# Patient Record
Sex: Male | Born: 1935 | Race: White | Hispanic: No | Marital: Married | Smoking: Former smoker
Health system: Southern US, Community
[De-identification: ages and names within clinical notes are randomized; demographics above are authoritative.]

## PROBLEM LIST (undated history)

## (undated) DIAGNOSIS — D333 Benign neoplasm of cranial nerves: Secondary | ICD-10-CM

## (undated) DIAGNOSIS — R609 Edema, unspecified: Secondary | ICD-10-CM

## (undated) DIAGNOSIS — M4800 Spinal stenosis, site unspecified: Secondary | ICD-10-CM

## (undated) DIAGNOSIS — F32A Depression, unspecified: Secondary | ICD-10-CM

## (undated) DIAGNOSIS — G500 Trigeminal neuralgia: Secondary | ICD-10-CM

## (undated) DIAGNOSIS — K2270 Barrett's esophagus without dysplasia: Secondary | ICD-10-CM

## (undated) DIAGNOSIS — F329 Major depressive disorder, single episode, unspecified: Secondary | ICD-10-CM

## (undated) DIAGNOSIS — G629 Polyneuropathy, unspecified: Secondary | ICD-10-CM

## (undated) DIAGNOSIS — K219 Gastro-esophageal reflux disease without esophagitis: Secondary | ICD-10-CM

## (undated) DIAGNOSIS — E119 Type 2 diabetes mellitus without complications: Secondary | ICD-10-CM

## (undated) DIAGNOSIS — N159 Renal tubulo-interstitial disease, unspecified: Secondary | ICD-10-CM

## (undated) HISTORY — PX: HERNIA REPAIR: SHX51

## (undated) HISTORY — PX: BRAIN SURGERY: SHX531

## (undated) HISTORY — PX: APPENDECTOMY: SHX54

---

## 2017-05-02 ENCOUNTER — Emergency Department (HOSPITAL_BASED_OUTPATIENT_CLINIC_OR_DEPARTMENT_OTHER): Payer: Medicare Other

## 2017-05-02 ENCOUNTER — Encounter (HOSPITAL_BASED_OUTPATIENT_CLINIC_OR_DEPARTMENT_OTHER): Payer: Self-pay

## 2017-05-02 ENCOUNTER — Other Ambulatory Visit: Payer: Self-pay

## 2017-05-02 ENCOUNTER — Observation Stay (HOSPITAL_BASED_OUTPATIENT_CLINIC_OR_DEPARTMENT_OTHER)
Admission: EM | Admit: 2017-05-02 | Discharge: 2017-05-03 | Disposition: A | Discharge status: Home / Self Care | Payer: Medicare Other | Attending: Internal Medicine | Admitting: Internal Medicine

## 2017-05-02 DIAGNOSIS — K219 Gastro-esophageal reflux disease without esophagitis: Secondary | ICD-10-CM | POA: Diagnosis present

## 2017-05-02 DIAGNOSIS — G500 Trigeminal neuralgia: Secondary | ICD-10-CM | POA: Diagnosis present

## 2017-05-02 DIAGNOSIS — Z886 Allergy status to analgesic agent status: Secondary | ICD-10-CM | POA: Insufficient documentation

## 2017-05-02 DIAGNOSIS — E1140 Type 2 diabetes mellitus with diabetic neuropathy, unspecified: Secondary | ICD-10-CM | POA: Insufficient documentation

## 2017-05-02 DIAGNOSIS — Z79899 Other long term (current) drug therapy: Secondary | ICD-10-CM | POA: Diagnosis not present

## 2017-05-02 DIAGNOSIS — W19XXXA Unspecified fall, initial encounter: Secondary | ICD-10-CM | POA: Diagnosis present

## 2017-05-02 DIAGNOSIS — R001 Bradycardia, unspecified: Secondary | ICD-10-CM | POA: Diagnosis not present

## 2017-05-02 DIAGNOSIS — Z885 Allergy status to narcotic agent status: Secondary | ICD-10-CM | POA: Insufficient documentation

## 2017-05-02 DIAGNOSIS — Z87891 Personal history of nicotine dependence: Secondary | ICD-10-CM | POA: Diagnosis not present

## 2017-05-02 DIAGNOSIS — S0990XA Unspecified injury of head, initial encounter: Secondary | ICD-10-CM | POA: Diagnosis not present

## 2017-05-02 DIAGNOSIS — E119 Type 2 diabetes mellitus without complications: Secondary | ICD-10-CM | POA: Diagnosis present

## 2017-05-02 DIAGNOSIS — S2232XA Fracture of one rib, left side, initial encounter for closed fracture: Secondary | ICD-10-CM | POA: Diagnosis present

## 2017-05-02 DIAGNOSIS — L8990 Pressure ulcer of unspecified site, unspecified stage: Secondary | ICD-10-CM

## 2017-05-02 DIAGNOSIS — S2242XA Multiple fractures of ribs, left side, initial encounter for closed fracture: Principal | ICD-10-CM | POA: Insufficient documentation

## 2017-05-02 DIAGNOSIS — Z7984 Long term (current) use of oral hypoglycemic drugs: Secondary | ICD-10-CM | POA: Insufficient documentation

## 2017-05-02 DIAGNOSIS — N400 Enlarged prostate without lower urinary tract symptoms: Secondary | ICD-10-CM | POA: Diagnosis present

## 2017-05-02 DIAGNOSIS — W010XXA Fall on same level from slipping, tripping and stumbling without subsequent striking against object, initial encounter: Secondary | ICD-10-CM | POA: Insufficient documentation

## 2017-05-02 DIAGNOSIS — S2249XA Multiple fractures of ribs, unspecified side, initial encounter for closed fracture: Secondary | ICD-10-CM

## 2017-05-02 DIAGNOSIS — H9190 Unspecified hearing loss, unspecified ear: Secondary | ICD-10-CM | POA: Diagnosis not present

## 2017-05-02 HISTORY — DX: Polyneuropathy, unspecified: G62.9

## 2017-05-02 HISTORY — DX: Gastro-esophageal reflux disease without esophagitis: K21.9

## 2017-05-02 HISTORY — DX: Benign neoplasm of cranial nerves: D33.3

## 2017-05-02 HISTORY — DX: Edema, unspecified: R60.9

## 2017-05-02 HISTORY — DX: Barrett's esophagus without dysplasia: K22.70

## 2017-05-02 HISTORY — DX: Spinal stenosis, site unspecified: M48.00

## 2017-05-02 HISTORY — DX: Renal tubulo-interstitial disease, unspecified: N15.9

## 2017-05-02 HISTORY — DX: Major depressive disorder, single episode, unspecified: F32.9

## 2017-05-02 HISTORY — DX: Depression, unspecified: F32.A

## 2017-05-02 HISTORY — DX: Trigeminal neuralgia: G50.0

## 2017-05-02 HISTORY — DX: Type 2 diabetes mellitus without complications: E11.9

## 2017-05-02 LAB — CBC WITH DIFFERENTIAL/PLATELET
BASOS PCT: 0 %
Basophils Absolute: 0 10*3/uL (ref 0.0–0.1)
EOS ABS: 0 10*3/uL (ref 0.0–0.7)
EOS PCT: 0 %
HEMATOCRIT: 36.4 % — AB (ref 39.0–52.0)
HEMOGLOBIN: 12.5 g/dL — AB (ref 13.0–17.0)
Lymphocytes Relative: 16 %
Lymphs Abs: 1.7 10*3/uL (ref 0.7–4.0)
MCH: 37.7 pg — AB (ref 26.0–34.0)
MCHC: 34.3 g/dL (ref 30.0–36.0)
MCV: 109.6 fL — AB (ref 78.0–100.0)
MONOS PCT: 7 %
Monocytes Absolute: 0.7 10*3/uL (ref 0.1–1.0)
NEUTROS ABS: 8 10*3/uL — AB (ref 1.7–7.7)
Neutrophils Relative %: 77 %
Platelets: 198 10*3/uL (ref 150–400)
RBC: 3.32 MIL/uL — ABNORMAL LOW (ref 4.22–5.81)
RDW: 13.7 % (ref 11.5–15.5)
WBC: 10.4 10*3/uL (ref 4.0–10.5)

## 2017-05-02 LAB — BASIC METABOLIC PANEL
Anion gap: 7 (ref 5–15)
BUN: 33 mg/dL — ABNORMAL HIGH (ref 6–20)
CALCIUM: 8.8 mg/dL — AB (ref 8.9–10.3)
CHLORIDE: 111 mmol/L (ref 101–111)
CO2: 21 mmol/L — AB (ref 22–32)
Creatinine, Ser: 1.08 mg/dL (ref 0.61–1.24)
GFR calc Af Amer: >60 mL/min (ref >60)
GFR calc non Af Amer: 60 mL/min — ABNORMAL LOW (ref >60)
GLUCOSE: 146 mg/dL — AB (ref 65–99)
Potassium: 4.1 mmol/L (ref 3.5–5.1)
Sodium: 139 mmol/L (ref 135–145)

## 2017-05-02 MED ORDER — HYDROCODONE-ACETAMINOPHEN 5-325 MG PO TABS
0.5000 | ORAL_TABLET | Freq: Once | ORAL | Status: AC
  Administered 2017-05-02: 0.5 via ORAL
  Filled 2017-05-02: qty 1

## 2017-05-02 MED ORDER — HYDROCODONE-ACETAMINOPHEN 5-325 MG PO TABS: 1.0000 | ORAL_TABLET | ORAL | Status: DC | PRN

## 2017-05-02 MED ORDER — HYDROCODONE-ACETAMINOPHEN 5-325 MG PO TABS
1.0000 | ORAL_TABLET | Freq: Once | ORAL | Status: AC
  Administered 2017-05-02: 1 via ORAL
  Filled 2017-05-02: qty 1

## 2017-05-02 MED ORDER — MORPHINE SULFATE (PF) 2 MG/ML IV SOLN
2.0000 mg | INTRAVENOUS | Status: DC | PRN
  Administered 2017-05-02: 2 mg via INTRAVENOUS
  Filled 2017-05-02: qty 1

## 2017-05-02 MED ORDER — IOPAMIDOL (ISOVUE-300) INJECTION 61%
100.0000 mL | Freq: Once | INTRAVENOUS | Status: AC | PRN
Start: 2017-05-02 — End: 2017-05-02
  Administered 2017-05-02: 100 mL via INTRAVENOUS

## 2017-05-02 NOTE — ED Notes (Signed)
Patient transported to CT 

## 2017-05-02 NOTE — ED Notes (Signed)
ED Provider at bedside. 

## 2017-05-02 NOTE — ED Triage Notes (Signed)
Per family pt lost balance/fell approx 4pm-pain to left rib area-denies head/neck injury-pt assisted from w/c to stretcher by staff x 2

## 2017-05-02 NOTE — ED Provider Notes (Signed)
[facility] EMERGENCY DEPARTMENT Provider Note   CSN: [id number] Arrival date & time: 05/02/17  1842     History   Chief Complaint Chief Complaint  Patient presents with  . Fall    HPI Garris Ewton is a 84 y.o. male.  HPI  84 year old male presents with left rib pain after a fall.  Around 2 PM today the patient was holding onto a cabinet and try to reach out to grab his walker and the walker turned over when he grabbed it and he fell.  He hit his left ribs which is where he has a bruise and severe pain.  Received Tylenol around 4 PM with no relief.  He denies any abdominal pain.  He has told family he did not hit his head and denies headache but when I asked he states he did hit his head but denies loss of consciousness.  No dizziness before or after.  He is not on a blood thinner per family.  Past Medical History:  Diagnosis Date  . Acoustic neuroma ([place])   . Barretts esophagus   . Depression   . Diabetes mellitus without complication ([place])   . Edema   . GERD (gastroesophageal reflux disease)   . Kidney infection   . Neuropathy   . Spinal stenosis   . Trigeminal neuralgia     There are no active problems to display for this patient.   Past Surgical History:  Procedure Laterality Date  . APPENDECTOMY    . BRAIN SURGERY    . HERNIA REPAIR         Home Medications    Prior to Admission medications   Medication Sig Start Date End Date Taking? Authorizing Provider  baci-polymyx-neo-hydrocort (CORTISPORIN) 1 % ointment Place into the left eye 2 (two) times daily.   Yes [provider]  carbamazepine (TEGRETOL) 100 MG chewable tablet Chew 100 mg by mouth daily.   Yes [provider]  docusate sodium (COLACE) 100 MG capsule Take 100 mg by mouth daily.   Yes [provider]  DULoxetine (CYMBALTA) 30 MG capsule Take 30 mg by mouth daily.   Yes [provider]  metFORMIN (GLUCOPHAGE) 500 MG tablet Take 500 mg by mouth 2 (two)  times daily with a meal.   Yes [provider]  neomycin-polymyxin b-dexamethasone (MAXITROL) 3.5-10000-0.1 OINT Place 1 application into the left eye.   Yes [provider]  omeprazole (PRILOSEC) 20 MG capsule Take 20 mg by mouth daily.   Yes [provider]  psyllium (REGULOID) 0.52 g capsule Take 0.52 g by mouth daily.   Yes [provider]  tamsulosin (FLOMAX) 0.4 MG CAPS capsule Take 0.4 mg by mouth.   Yes [provider]    Family History No family history on file.  Social History Social History   Tobacco Use  . Smoking status: Former Research scientist (life sciences)  . Smokeless tobacco: Never Used  Substance Use Topics  . Alcohol use: No    Frequency: Never  . Drug use: No     Allergies   Aspirin and Fentanyl   Review of Systems Review of Systems  Cardiovascular: Positive for chest pain (ribs).  Gastrointestinal: Negative for abdominal pain.  Neurological: Negative for dizziness, syncope and headaches.  All other systems reviewed and are negative.    Physical Exam Updated Vital Signs BP (!) 143/90 (BP Location: Left Arm)   Pulse 60   Temp 97.7 F (36.5 C) (Oral)   Resp 20  Ht 5\' 10"  (1.778 m)   Wt 71.7 kg (158 lb)   SpO2 98%   BMI 22.67 kg/m   Physical Exam  Constitutional: He is oriented to person, place, and time. He appears well-developed and well-nourished.  Hard of hearing  HENT:  Head: Normocephalic and atraumatic.  Right Ear: External ear normal.  Left Ear: External ear normal.  Nose: Nose normal.  Eyes: Right eye exhibits no discharge. Left eye exhibits no discharge.  Neck: Neck supple.  Cardiovascular: Normal rate, regular rhythm and normal heart sounds.  Pulmonary/Chest: Effort normal and breath sounds normal. He exhibits tenderness.  Abdominal: Soft. He exhibits no distension. There is no tenderness.    Musculoskeletal: He exhibits no edema.  Neurological: He is alert and oriented to person, place, and time.    Skin: Skin is warm and dry.  Nursing note and vitals reviewed.    ED Treatments / Results  Labs (all labs ordered are listed, but only abnormal results are displayed) Labs Reviewed  BASIC METABOLIC PANEL - Abnormal; Notable for the following components:      Result Value   CO2 21 (*)    Glucose, Bld 146 (*)    BUN 33 (*)    Calcium 8.8 (*)    GFR calc non Af Amer 60 (*)    All other components within normal limits  CBC WITH DIFFERENTIAL/PLATELET - Abnormal; Notable for the following components:   RBC 3.32 (*)    Hemoglobin 12.5 (*)    HCT 36.4 (*)    MCV 109.6 (*)    MCH 37.7 (*)    Neutro Abs 8.0 (*)    All other components within normal limits    EKG  EKG Interpretation None       Radiology Dg Ribs Unilateral W/chest Left  Result Date: 05/02/2017 CLINICAL DATA:  Status post fall.  Left lower anterior rib pain EXAM: LEFT RIBS AND CHEST - 3+ VIEW COMPARISON:  None. FINDINGS: No fracture or other bone lesions are seen involving the ribs. There is no evidence of pneumothorax or pleural effusion. Mild right basilar atelectasis. Hazy left lower lobe airspace disease which may reflect atelectasis versus pneumonia. Heart size and mediastinal contours are within normal limits. IMPRESSION: 1. No acute injury of the left ribs. 2. Mild right basilar atelectasis. Hazy left lower lobe airspace disease which may reflect atelectasis versus pneumonia. Electronically Signed   By: Kathreen Devoid   On: 05/02/2017 20:33   Ct Head Wo Contrast  Result Date: 05/02/2017 CLINICAL DATA:  Head trauma, status post fall. EXAM: CT HEAD WITHOUT CONTRAST TECHNIQUE: Contiguous axial images were obtained from the base of the skull through the vertex without intravenous contrast. COMPARISON:  None. FINDINGS: Brain: No evidence of acute infarction, hemorrhage, extra-axial collection, ventriculomegaly, or mass effect. Large area of right frontal lobe encephalomalacia. Generalized cerebral atrophy.  Periventricular white matter low attenuation likely secondary to microangiopathy. Vascular: Cerebrovascular atherosclerotic calcifications are noted. Skull: Prior right frontotemporal craniotomy. No acute osseous abnormality. Sinuses/Orbits: Visualized portions of the orbits are unremarkable. Visualized portions of the paranasal sinuses and mastoid air cells are unremarkable. Other: None. IMPRESSION: No acute intracranial pathology. Electronically Signed   By: Kathreen Devoid   On: 05/02/2017 20:24   Ct Chest W Contrast  Result Date: 05/02/2017 CLINICAL DATA:  Patient fell tonight and presents with left lower to anterior rib pain. EXAM: CT CHEST, ABDOMEN, AND PELVIS WITH CONTRAST TECHNIQUE: Multidetector CT imaging of the chest, abdomen and pelvis was performed following  the standard protocol during bolus administration of intravenous contrast. CONTRAST:  80 cc Isovue-300 COMPARISON:  None. FINDINGS: CT CHEST FINDINGS Cardiovascular: Normal branch pattern of the great vessels without stenosis. Aortic atherosclerosis with ectasia. No dissection. No evidence of mediastinal hematoma. Dilated main pulmonary artery to 3.7 cm consistent with chronic pulmonary hypertension. Heart is enlarged with trace pericardial effusion posteriorly on the left. Coronary arterial calcifications are identified left circumflex. No large central pulmonary embolus is noted though the study is not tailored for the assessment of embolus. Mediastinum/Nodes: Patent trachea and mainstem bronchi. No mediastinal nor hilar adenopathy. No thyromegaly or mass. Small amount of fluid in the distal esophagus associated small hiatal hernia. Lungs/Pleura: Bilateral dependent atelectasis. Peribronchial thickening is noted bilaterally to the lower lobes in particular. Patchy pneumonic consolidations seen in the left lower lobe. Trace fluid in the left major fissure. Musculoskeletal: Acute anterolateral left sixth through ninth rib fractures with chronic  healed posterior left eleventh rib fracture is identified. Angulated fracture, remote in appearance involving the right second anterior rib. Small cartilaginous rests along the lateral aspect of the right eighth rib. CT ABDOMEN PELVIS FINDINGS Hepatobiliary: Colonic interposition between the liver and ventral abdominal wall. No enhancing mass lesion of the liver. No liver laceration. Physiologic distention of the gallbladder without stones. No biliary dilatation. Pancreas: Marked fatty atrophy of the pancreas. Small cystic lesions involving the body of the pancreas measuring 7 and 9 mm each that may represent small side branch intraductal papillary mucinous neoplasms. Spleen: Normal without laceration. Adrenals/Urinary Tract: Bilateral adrenal nodules compatible measuring up to 1.9 cm on the left and 2.4 cm on the right. Findings are indeterminate but may represent benign adenomata which can be confirmed with nonemergent CT or MRI with adrenal mass protocol. Renal atrophy with cortical thinning is noted bilaterally. Indeterminate hypodensity off the upper pole the left kidney measuring 7 mm is identified with a similar-sized 6 mm hypodensity off the lower pole the right kidney. These may represent simple and/or complex cysts but are too small to further characterize. Bladder diverticula are noted the largest is posterolateral on the left measuring 9.5 x 5.3 x 8.5 cm. Smaller posterior bladder diverticulum containing either mural calcification or dependent calculi/milk of calcium is noted. Stomach/Bowel: Small hiatal hernia. Nondistended stomach with normal small bowel rotation. No mural hematoma or thickening. No bowel obstruction or inflammation. The appendix is surgically absent by report. Mild circumferential thickening in the anorectal region may reflect internal hemorrhoids. Vascular/Lymphatic: Mild to moderate aortoiliac atherosclerosis without aneurysm. No lymphadenopathy. Reproductive: Enlarged prostate  measuring 4.8 x 6.4 x 4.7 cm with central and peripheral zone calcifications. Other: No abdominal wall hernia or abnormality. No abdominopelvic ascites. Musculoskeletal: Remote appearing osteoporotic compression fractures with mild biconcave compressions of T4, T6, mild-to-moderate inferior endplate compressions of T9 and T10 and moderate biconcave compression fractures at T12 and L1. Grade 1 anterolisthesis associated degenerative disc disease noted at L3-4. IMPRESSION: Chest CT: 1. Acute anterolateral left sixth through ninth rib fractures with age-indeterminate anterior angulated fracture of the right second rib. Remote appearing posterior left eleventh rib fracture with callus. No associated pneumothorax. 2. Patchy left lower lobe pulmonary consolidations suspicious for pneumonia. 3. Coronary arteriosclerosis along the left circumflex. 4. Mild dilatation of the main pulmonary artery compatible with chronic pulmonary hypertension. CT abdomen and pelvis: 1. Two small cystic lesions of the pancreatic body measuring 7 and 9 mm that may represent small side branch cystic IPMN. 2. Bilateral adrenal nodules that may reflect benign adenomata. These  can be further worked up non emergently with dedicated CT or MRI with adrenal mass protocol. 3. Tiny too small to further characterize simple and complex appearing cysts of both kidneys without worrisome features. 4. Multiple bladder diverticula, the largest on the left measuring 9.5 x 5.3 x 8.5 cm. Smaller posterior bladder diverticula with milk of calcium are noted. 5. Enlarged prostate. 6. Multiple osteoporotic appearing compression fractures of the thoracolumbar spine. Grade 1 anterolisthesis of L3 on L4 associated with degenerative disc disease. Electronically Signed   By: Ashley Royalty M.D.   On: 05/02/2017 22:55   Ct Abdomen Pelvis W Contrast  Result Date: 05/02/2017 CLINICAL DATA:  Patient fell tonight and presents with left lower to anterior rib pain. EXAM: CT  CHEST, ABDOMEN, AND PELVIS WITH CONTRAST TECHNIQUE: Multidetector CT imaging of the chest, abdomen and pelvis was performed following the standard protocol during bolus administration of intravenous contrast. CONTRAST:  80 cc Isovue-300 COMPARISON:  None. FINDINGS: CT CHEST FINDINGS Cardiovascular: Normal branch pattern of the great vessels without stenosis. Aortic atherosclerosis with ectasia. No dissection. No evidence of mediastinal hematoma. Dilated main pulmonary artery to 3.7 cm consistent with chronic pulmonary hypertension. Heart is enlarged with trace pericardial effusion posteriorly on the left. Coronary arterial calcifications are identified left circumflex. No large central pulmonary embolus is noted though the study is not tailored for the assessment of embolus. Mediastinum/Nodes: Patent trachea and mainstem bronchi. No mediastinal nor hilar adenopathy. No thyromegaly or mass. Small amount of fluid in the distal esophagus associated small hiatal hernia. Lungs/Pleura: Bilateral dependent atelectasis. Peribronchial thickening is noted bilaterally to the lower lobes in particular. Patchy pneumonic consolidations seen in the left lower lobe. Trace fluid in the left major fissure. Musculoskeletal: Acute anterolateral left sixth through ninth rib fractures with chronic healed posterior left eleventh rib fracture is identified. Angulated fracture, remote in appearance involving the right second anterior rib. Small cartilaginous rests along the lateral aspect of the right eighth rib. CT ABDOMEN PELVIS FINDINGS Hepatobiliary: Colonic interposition between the liver and ventral abdominal wall. No enhancing mass lesion of the liver. No liver laceration. Physiologic distention of the gallbladder without stones. No biliary dilatation. Pancreas: Marked fatty atrophy of the pancreas. Small cystic lesions involving the body of the pancreas measuring 7 and 9 mm each that may represent small side branch intraductal  papillary mucinous neoplasms. Spleen: Normal without laceration. Adrenals/Urinary Tract: Bilateral adrenal nodules compatible measuring up to 1.9 cm on the left and 2.4 cm on the right. Findings are indeterminate but may represent benign adenomata which can be confirmed with nonemergent CT or MRI with adrenal mass protocol. Renal atrophy with cortical thinning is noted bilaterally. Indeterminate hypodensity off the upper pole the left kidney measuring 7 mm is identified with a similar-sized 6 mm hypodensity off the lower pole the right kidney. These may represent simple and/or complex cysts but are too small to further characterize. Bladder diverticula are noted the largest is posterolateral on the left measuring 9.5 x 5.3 x 8.5 cm. Smaller posterior bladder diverticulum containing either mural calcification or dependent calculi/milk of calcium is noted. Stomach/Bowel: Small hiatal hernia. Nondistended stomach with normal small bowel rotation. No mural hematoma or thickening. No bowel obstruction or inflammation. The appendix is surgically absent by report. Mild circumferential thickening in the anorectal region may reflect internal hemorrhoids. Vascular/Lymphatic: Mild to moderate aortoiliac atherosclerosis without aneurysm. No lymphadenopathy. Reproductive: Enlarged prostate measuring 4.8 x 6.4 x 4.7 cm with central and peripheral zone calcifications. Other: No abdominal wall  hernia or abnormality. No abdominopelvic ascites. Musculoskeletal: Remote appearing osteoporotic compression fractures with mild biconcave compressions of T4, T6, mild-to-moderate inferior endplate compressions of T9 and T10 and moderate biconcave compression fractures at T12 and L1. Grade 1 anterolisthesis associated degenerative disc disease noted at L3-4. IMPRESSION: Chest CT: 1. Acute anterolateral left sixth through ninth rib fractures with age-indeterminate anterior angulated fracture of the right second rib. Remote appearing posterior  left eleventh rib fracture with callus. No associated pneumothorax. 2. Patchy left lower lobe pulmonary consolidations suspicious for pneumonia. 3. Coronary arteriosclerosis along the left circumflex. 4. Mild dilatation of the main pulmonary artery compatible with chronic pulmonary hypertension. CT abdomen and pelvis: 1. Two small cystic lesions of the pancreatic body measuring 7 and 9 mm that may represent small side branch cystic IPMN. 2. Bilateral adrenal nodules that may reflect benign adenomata. These can be further worked up non emergently with dedicated CT or MRI with adrenal mass protocol. 3. Tiny too small to further characterize simple and complex appearing cysts of both kidneys without worrisome features. 4. Multiple bladder diverticula, the largest on the left measuring 9.5 x 5.3 x 8.5 cm. Smaller posterior bladder diverticula with milk of calcium are noted. 5. Enlarged prostate. 6. Multiple osteoporotic appearing compression fractures of the thoracolumbar spine. Grade 1 anterolisthesis of L3 on L4 associated with degenerative disc disease. Electronically Signed   By: Ashley Royalty M.D.   On: 05/02/2017 22:55    Procedures Procedures (including critical care time)  Medications Ordered in ED Medications  morphine 2 MG/ML injection 2 mg (not administered)  HYDROcodone-acetaminophen (NORCO/VICODIN) 5-325 MG per tablet 0.5 tablet (0.5 tablets Oral Given 05/02/17 1915)  HYDROcodone-acetaminophen (NORCO/VICODIN) 5-325 MG per tablet 1 tablet (1 tablet Oral Given 05/02/17 2056)  iopamidol (ISOVUE-300) 61 % injection 100 mL (100 mLs Intravenous Contrast Given 05/02/17 2147)     Initial Impression / Assessment and Plan / ED Course  I have reviewed the triage vital signs and the nursing notes.  Pertinent labs & imaging results that were available during my care of the patient were reviewed by me and considered in my medical decision making (see chart for details).     Patient still in pain despite  half Norco and was given a full Norco.  Pain is better controlled but still moderate to severe.  Thus CT chest abdomen pelvis obtained.  Head CT had already been obtained given he claims he hit his head.  The chest abdomen pelvis shows 4 rib fractures, 6-9.  No obvious pneumothorax or hemothorax.  There is a possible pneumonia but I favor this is likely more atelectasis or contusion given no shortness of breath or cough.  I discussed with Dr. Ninfa Linden of trauma surgery and at this point he could be admitted to the hospitalist service for supportive care.  Discussed with Dr. Blaine Hamper who accepts in transfer and admission to [facility].  Final Clinical Impressions(s) / ED Diagnoses   Final diagnoses:  Closed fracture of multiple ribs of left side, initial encounter    ED Discharge Orders    None       Sherwood Gambler, MD 05/02/17 2343

## 2017-05-02 NOTE — Care Management (Signed)
This is a no charge note  Transfer from [facility] per Dr. Regenia Skeeter  84 year old woman with past medical of trigeminal neuralgia, spinal stenosis, BPH, diabetes mellitus, GERD, depression, who presents with fall when her walker turned over.  Patient has left hip fracture 6-9. Negative CT head. CT abdomen/pelvis no acute traumatic issue, but showed pancreatic cystic lesion, adrenal gland nodule and bladder diverticula. Trauma surgeon, Dr. Rush Farmer was consulted.   Patient was found to have WBC 10.4, electrolytes renal function okay, temperature normal, bradycardia, oxygen 96% on room air, blood pressure 143/90. Patient is placed on telemetry bed for observation.  Please call manager of [facility] at [phone] when pt arrives to floor   Ivor Costa, MD  [facility] Pager [phone]  If 7PM-7AM, please contact night-coverage [email or web address] Password [email or web address] 05/02/2017, 11:40 PM

## 2017-05-03 ENCOUNTER — Encounter (HOSPITAL_COMMUNITY): Payer: Self-pay | Admitting: Internal Medicine

## 2017-05-03 ENCOUNTER — Observation Stay (HOSPITAL_COMMUNITY): Payer: Medicare Other

## 2017-05-03 DIAGNOSIS — E119 Type 2 diabetes mellitus without complications: Secondary | ICD-10-CM | POA: Diagnosis not present

## 2017-05-03 DIAGNOSIS — W19XXXA Unspecified fall, initial encounter: Secondary | ICD-10-CM | POA: Diagnosis not present

## 2017-05-03 DIAGNOSIS — S2242XA Multiple fractures of ribs, left side, initial encounter for closed fracture: Secondary | ICD-10-CM | POA: Diagnosis not present

## 2017-05-03 DIAGNOSIS — L8990 Pressure ulcer of unspecified site, unspecified stage: Secondary | ICD-10-CM

## 2017-05-03 DIAGNOSIS — R001 Bradycardia, unspecified: Secondary | ICD-10-CM | POA: Diagnosis not present

## 2017-05-03 DIAGNOSIS — E1140 Type 2 diabetes mellitus with diabetic neuropathy, unspecified: Secondary | ICD-10-CM | POA: Diagnosis not present

## 2017-05-03 DIAGNOSIS — S0990XA Unspecified injury of head, initial encounter: Secondary | ICD-10-CM | POA: Diagnosis not present

## 2017-05-03 LAB — CBC WITH DIFFERENTIAL/PLATELET
BASOS PCT: 1 %
Basophils Absolute: 0.1 10*3/uL (ref 0.0–0.1)
EOS ABS: 0.1 10*3/uL (ref 0.0–0.7)
EOS PCT: 1 %
HCT: 36.9 % — ABNORMAL LOW (ref 39.0–52.0)
Hemoglobin: 12.4 g/dL — ABNORMAL LOW (ref 13.0–17.0)
LYMPHS ABS: 1.8 10*3/uL (ref 0.7–4.0)
Lymphocytes Relative: 19 %
MCH: 37.2 pg — AB (ref 26.0–34.0)
MCHC: 33.6 g/dL (ref 30.0–36.0)
MCV: 110.8 fL — ABNORMAL HIGH (ref 78.0–100.0)
MONOS PCT: 9 %
Monocytes Absolute: 0.9 10*3/uL (ref 0.1–1.0)
Neutro Abs: 6.8 10*3/uL (ref 1.7–7.7)
Neutrophils Relative %: 70 %
PLATELETS: 215 10*3/uL (ref 150–400)
RBC: 3.33 MIL/uL — ABNORMAL LOW (ref 4.22–5.81)
RDW: 14.4 % (ref 11.5–15.5)
WBC: 9.6 10*3/uL (ref 4.0–10.5)

## 2017-05-03 LAB — BASIC METABOLIC PANEL
Anion gap: 9 (ref 5–15)
BUN: 27 mg/dL — AB (ref 6–20)
CALCIUM: 8.7 mg/dL — AB (ref 8.9–10.3)
CO2: 21 mmol/L — AB (ref 22–32)
CREATININE: 0.93 mg/dL (ref 0.61–1.24)
Chloride: 109 mmol/L (ref 101–111)
GFR calc non Af Amer: >60 mL/min (ref >60)
Glucose, Bld: 116 mg/dL — ABNORMAL HIGH (ref 65–99)
Potassium: 3.9 mmol/L (ref 3.5–5.1)
SODIUM: 139 mmol/L (ref 135–145)

## 2017-05-03 LAB — TSH: TSH: 4.173 u[IU]/mL (ref 0.350–4.500)

## 2017-05-03 LAB — GLUCOSE, CAPILLARY
GLUCOSE-CAPILLARY: 108 mg/dL — AB (ref 65–99)
Glucose-Capillary: 154 mg/dL — ABNORMAL HIGH (ref 65–99)

## 2017-05-03 LAB — CARBAMAZEPINE LEVEL, TOTAL

## 2017-05-03 MED ORDER — HEPARIN SODIUM (PORCINE) 5000 UNIT/ML IJ SOLN
5000.0000 [IU] | Freq: Three times a day (TID) | INTRAMUSCULAR | Status: DC
  Administered 2017-05-03: 5000 [IU] via SUBCUTANEOUS
  Filled 2017-05-03: qty 1

## 2017-05-03 MED ORDER — DOCUSATE SODIUM 100 MG PO CAPS
100.0000 mg | ORAL_CAPSULE | Freq: Every day | ORAL | Status: DC
  Administered 2017-05-03: 100 mg via ORAL
  Filled 2017-05-03: qty 1

## 2017-05-03 MED ORDER — ACETAMINOPHEN 650 MG RE SUPP: 650.0000 mg | Freq: Four times a day (QID) | RECTAL | Status: DC | PRN

## 2017-05-03 MED ORDER — ACETAMINOPHEN 325 MG PO TABS: 650.0000 mg | ORAL_TABLET | Freq: Four times a day (QID) | ORAL | Status: AC | PRN

## 2017-05-03 MED ORDER — NEOMYCIN-POLYMYXIN-DEXAMETH 3.5-10000-0.1 OP OINT
1.0000 "application " | TOPICAL_OINTMENT | Freq: Three times a day (TID) | OPHTHALMIC | Status: DC
  Administered 2017-05-03: 1 via OPHTHALMIC
  Filled 2017-05-03: qty 3.5

## 2017-05-03 MED ORDER — PANTOPRAZOLE SODIUM 40 MG PO TBEC
40.0000 mg | DELAYED_RELEASE_TABLET | Freq: Every day | ORAL | Status: DC
  Administered 2017-05-03: 40 mg via ORAL
  Filled 2017-05-03: qty 1

## 2017-05-03 MED ORDER — TAMSULOSIN HCL 0.4 MG PO CAPS: 0.4000 mg | ORAL_CAPSULE | Freq: Every day | ORAL | Status: DC

## 2017-05-03 MED ORDER — DULOXETINE HCL 30 MG PO CPEP
30.0000 mg | ORAL_CAPSULE | Freq: Every day | ORAL | Status: DC
  Administered 2017-05-03: 30 mg via ORAL
  Filled 2017-05-03: qty 1

## 2017-05-03 MED ORDER — ONDANSETRON HCL 4 MG PO TABS: 4.0000 mg | ORAL_TABLET | Freq: Four times a day (QID) | ORAL | Status: DC | PRN

## 2017-05-03 MED ORDER — HYDROCODONE-ACETAMINOPHEN 5-325 MG PO TABS
1.0000 | ORAL_TABLET | Freq: Four times a day (QID) | ORAL | Status: DC | PRN
  Filled 2017-05-03 (×2): qty 1

## 2017-05-03 MED ORDER — INSULIN ASPART 100 UNIT/ML ~~LOC~~ SOLN
0.0000 [IU] | Freq: Three times a day (TID) | SUBCUTANEOUS | Status: DC
  Administered 2017-05-03: 2 [IU] via SUBCUTANEOUS

## 2017-05-03 MED ORDER — HYDROCODONE-ACETAMINOPHEN 5-325 MG PO TABS: 1.0000 | ORAL_TABLET | Freq: Four times a day (QID) | ORAL | 0 refills | Status: AC | PRN

## 2017-05-03 MED ORDER — ONDANSETRON HCL 4 MG/2ML IJ SOLN
4.0000 mg | Freq: Four times a day (QID) | INTRAMUSCULAR | Status: DC | PRN
Start: 2017-05-03 — End: 2017-05-03

## 2017-05-03 MED ORDER — BACITRACIN-POLYMYX-NEO-HC 1 % OP OINT: TOPICAL_OINTMENT | Freq: Two times a day (BID) | OPHTHALMIC | Status: DC

## 2017-05-03 MED ORDER — MORPHINE SULFATE (PF) 4 MG/ML IV SOLN: 2.0000 mg | INTRAVENOUS | Status: DC | PRN

## 2017-05-03 MED ORDER — PSYLLIUM 95 % PO PACK
1.0000 | PACK | Freq: Every day | ORAL | Status: DC
Start: 2017-05-03 — End: 2017-05-03
  Administered 2017-05-03: 1 via ORAL
  Filled 2017-05-03: qty 1

## 2017-05-03 MED ORDER — CARBAMAZEPINE 100 MG PO CHEW
100.0000 mg | CHEWABLE_TABLET | Freq: Every day | ORAL | Status: DC
  Administered 2017-05-03: 100 mg via ORAL
  Filled 2017-05-03: qty 1

## 2017-05-03 MED ORDER — ACETAMINOPHEN 325 MG PO TABS: 650.0000 mg | ORAL_TABLET | Freq: Four times a day (QID) | ORAL | Status: DC | PRN

## 2017-05-03 NOTE — ED Notes (Signed)
Pt transferred at [place] via [facility] to [facility] during downtime.

## 2017-05-03 NOTE — Discharge Summary (Signed)
Physician Discharge Summary  Terry Harrell CHY:[id number] DOB: 1927-12-29 DOA: 05/02/2017  PCP: Terry Leach, MD  Admit date: 05/02/2017 Discharge date: 05/03/2017   Recommendations for Outpatient Follow-Up:   1. Incentive spirometry 2. PT/OT/RN home health   Discharge Diagnosis:   Principal Problem:   Fall Active Problems:   GERD (gastroesophageal reflux disease)   Diabetes mellitus without complication (HCC)   Trigeminal neuralgia   Left rib fracture   BPH (benign prostatic hyperplasia)   Pressure injury of skin   Discharge disposition:  Home.  Discharge Condition: Improved.  Diet recommendation:   Carbohydrate-modified  Wound care: None.   History of Present Illness:   Terry Harrell is a 84 y.o. male with history of diabetes mellitus type, caustic neuroma, trigeminal neuralgia, difficulty hearing had a fall at home while trying to reach his walker.  Patient on fall he hit his left side of the chest denies hitting his head or losing consciousness.  Due to persistent pain patient was brought to the ER.     Hospital Course by Problem:   Mechanical Fall with left-sided rib fractures involving the sixth through ninth -patient placed on incentive spirometry and pain relief medication.   -did well with PT  Consolidation seen in CT chest -no definite signs of pneumonia no antibiotics placed at this time, suspect pulmonary contusion  Sinus bradycardia  -TSH normal -fall appeared to be mechanical--  Diabetes mellitus type 2  -resume home meds.  Trigeminal neuralgia on Tegretol -levels low, defer to PCP  History of acoustic neuroma.   Macrocytic anemia -if there is no significant fall in hemoglobin then further workup as outpatient.       Medical Consultants:    trauma   Discharge Exam:   Vitals:   05/03/17 0240 05/03/17 0503  BP: 138/70 134/86  Pulse: (!) 55 (!) 56  Resp: 16 16  Temp: 98.2 F (36.8 C) 97.9 F (36.6 C)  SpO2: 95% 93%    Vitals:   05/03/17 0015 05/03/17 0030 05/03/17 0240 05/03/17 0503  BP:  138/81 138/70 134/86  Pulse:   (!) 55 (!) 56  Resp: 17 14 16 16   Temp:   98.2 F (36.8 C) 97.9 F (36.6 C)  TempSrc:   Oral Oral  SpO2:   95% 93%  Weight:   71.2 kg (157 lb)   Height:        Gen:  NAD    The results of significant diagnostics from this hospitalization (including imaging, microbiology, ancillary and laboratory) are listed below for reference.     Procedures and Diagnostic Studies:   Dg Ribs Unilateral W/chest Left  Result Date: 05/02/2017 CLINICAL DATA:  Status post fall.  Left lower anterior rib pain EXAM: LEFT RIBS AND CHEST - 3+ VIEW COMPARISON:  None. FINDINGS: No fracture or other bone lesions are seen involving the ribs. There is no evidence of pneumothorax or pleural effusion. Mild right basilar atelectasis. Hazy left lower lobe airspace disease which may reflect atelectasis versus pneumonia. Heart size and mediastinal contours are within normal limits. IMPRESSION: 1. No acute injury of the left ribs. 2. Mild right basilar atelectasis. Hazy left lower lobe airspace disease which may reflect atelectasis versus pneumonia. Electronically Signed   By: Terry Harrell   On: 05/02/2017 20:33   Ct Head Wo Contrast  Result Date: 05/02/2017 CLINICAL DATA:  Head trauma, status post fall. EXAM: CT HEAD WITHOUT CONTRAST TECHNIQUE: Contiguous axial images were obtained from the base of the skull through the  vertex without intravenous contrast. COMPARISON:  None. FINDINGS: Brain: No evidence of acute infarction, hemorrhage, extra-axial collection, ventriculomegaly, or mass effect. Large area of right frontal lobe encephalomalacia. Generalized cerebral atrophy. Periventricular white matter low attenuation likely secondary to microangiopathy. Vascular: Cerebrovascular atherosclerotic calcifications are noted. Skull: Prior right frontotemporal craniotomy. No acute osseous abnormality. Sinuses/Orbits:  Visualized portions of the orbits are unremarkable. Visualized portions of the paranasal sinuses and mastoid air cells are unremarkable. Other: None. IMPRESSION: No acute intracranial pathology. Electronically Signed   By: Terry Harrell   On: 05/02/2017 20:24   Ct Chest W Contrast  Result Date: 05/02/2017 CLINICAL DATA:  Patient fell tonight and presents with left lower to anterior rib pain. EXAM: CT CHEST, ABDOMEN, AND PELVIS WITH CONTRAST TECHNIQUE: Multidetector CT imaging of the chest, abdomen and pelvis was performed following the standard protocol during bolus administration of intravenous contrast. CONTRAST:  80 cc Isovue-300 COMPARISON:  None. FINDINGS: CT CHEST FINDINGS Cardiovascular: Normal branch pattern of the great vessels without stenosis. Aortic atherosclerosis with ectasia. No dissection. No evidence of mediastinal hematoma. Dilated main pulmonary artery to 3.7 cm consistent with chronic pulmonary hypertension. Heart is enlarged with trace pericardial effusion posteriorly on the left. Coronary arterial calcifications are identified left circumflex. No large central pulmonary embolus is noted though the study is not tailored for the assessment of embolus. Mediastinum/Nodes: Patent trachea and mainstem bronchi. No mediastinal nor hilar adenopathy. No thyromegaly or mass. Small amount of fluid in the distal esophagus associated small hiatal hernia. Lungs/Pleura: Bilateral dependent atelectasis. Peribronchial thickening is noted bilaterally to the lower lobes in particular. Patchy pneumonic consolidations seen in the left lower lobe. Trace fluid in the left major fissure. Musculoskeletal: Acute anterolateral left sixth through ninth rib fractures with chronic healed posterior left eleventh rib fracture is identified. Angulated fracture, remote in appearance involving the right second anterior rib. Small cartilaginous rests along the lateral aspect of the right eighth rib. CT ABDOMEN PELVIS FINDINGS  Hepatobiliary: Colonic interposition between the liver and ventral abdominal wall. No enhancing mass lesion of the liver. No liver laceration. Physiologic distention of the gallbladder without stones. No biliary dilatation. Pancreas: Marked fatty atrophy of the pancreas. Small cystic lesions involving the body of the pancreas measuring 7 and 9 mm each that may represent small side branch intraductal papillary mucinous neoplasms. Spleen: Normal without laceration. Adrenals/Urinary Tract: Bilateral adrenal nodules compatible measuring up to 1.9 cm on the left and 2.4 cm on the right. Findings are indeterminate but may represent benign adenomata which can be confirmed with nonemergent CT or MRI with adrenal mass protocol. Renal atrophy with cortical thinning is noted bilaterally. Indeterminate hypodensity off the upper pole the left kidney measuring 7 mm is identified with a similar-sized 6 mm hypodensity off the lower pole the right kidney. These may represent simple and/or complex cysts but are too small to further characterize. Bladder diverticula are noted the largest is posterolateral on the left measuring 9.5 x 5.3 x 8.5 cm. Smaller posterior bladder diverticulum containing either mural calcification or dependent calculi/milk of calcium is noted. Stomach/Bowel: Small hiatal hernia. Nondistended stomach with normal small bowel rotation. No mural hematoma or thickening. No bowel obstruction or inflammation. The appendix is surgically absent by report. Mild circumferential thickening in the anorectal region may reflect internal hemorrhoids. Vascular/Lymphatic: Mild to moderate aortoiliac atherosclerosis without aneurysm. No lymphadenopathy. Reproductive: Enlarged prostate measuring 4.8 x 6.4 x 4.7 cm with central and peripheral zone calcifications. Other: No abdominal wall hernia or abnormality. No abdominopelvic  ascites. Musculoskeletal: Remote appearing osteoporotic compression fractures with mild biconcave  compressions of T4, T6, mild-to-moderate inferior endplate compressions of T9 and T10 and moderate biconcave compression fractures at T12 and L1. Grade 1 anterolisthesis associated degenerative disc disease noted at L3-4. IMPRESSION: Chest CT: 1. Acute anterolateral left sixth through ninth rib fractures with age-indeterminate anterior angulated fracture of the right second rib. Remote appearing posterior left eleventh rib fracture with callus. No associated pneumothorax. 2. Patchy left lower lobe pulmonary consolidations suspicious for pneumonia. 3. Coronary arteriosclerosis along the left circumflex. 4. Mild dilatation of the main pulmonary artery compatible with chronic pulmonary hypertension. CT abdomen and pelvis: 1. Two small cystic lesions of the pancreatic body measuring 7 and 9 mm that may represent small side branch cystic IPMN. 2. Bilateral adrenal nodules that may reflect benign adenomata. These can be further worked up non emergently with dedicated CT or MRI with adrenal mass protocol. 3. Tiny too small to further characterize simple and complex appearing cysts of both kidneys without worrisome features. 4. Multiple bladder diverticula, the largest on the left measuring 9.5 x 5.3 x 8.5 cm. Smaller posterior bladder diverticula with milk of calcium are noted. 5. Enlarged prostate. 6. Multiple osteoporotic appearing compression fractures of the thoracolumbar spine. Grade 1 anterolisthesis of L3 on L4 associated with degenerative disc disease. Electronically Signed   By: Ashley Royalty M.D.   On: 05/02/2017 22:55   Ct Abdomen Pelvis W Contrast  Result Date: 05/02/2017 CLINICAL DATA:  Patient fell tonight and presents with left lower to anterior rib pain. EXAM: CT CHEST, ABDOMEN, AND PELVIS WITH CONTRAST TECHNIQUE: Multidetector CT imaging of the chest, abdomen and pelvis was performed following the standard protocol during bolus administration of intravenous contrast. CONTRAST:  80 cc Isovue-300  COMPARISON:  None. FINDINGS: CT CHEST FINDINGS Cardiovascular: Normal branch pattern of the great vessels without stenosis. Aortic atherosclerosis with ectasia. No dissection. No evidence of mediastinal hematoma. Dilated main pulmonary artery to 3.7 cm consistent with chronic pulmonary hypertension. Heart is enlarged with trace pericardial effusion posteriorly on the left. Coronary arterial calcifications are identified left circumflex. No large central pulmonary embolus is noted though the study is not tailored for the assessment of embolus. Mediastinum/Nodes: Patent trachea and mainstem bronchi. No mediastinal nor hilar adenopathy. No thyromegaly or mass. Small amount of fluid in the distal esophagus associated small hiatal hernia. Lungs/Pleura: Bilateral dependent atelectasis. Peribronchial thickening is noted bilaterally to the lower lobes in particular. Patchy pneumonic consolidations seen in the left lower lobe. Trace fluid in the left major fissure. Musculoskeletal: Acute anterolateral left sixth through ninth rib fractures with chronic healed posterior left eleventh rib fracture is identified. Angulated fracture, remote in appearance involving the right second anterior rib. Small cartilaginous rests along the lateral aspect of the right eighth rib. CT ABDOMEN PELVIS FINDINGS Hepatobiliary: Colonic interposition between the liver and ventral abdominal wall. No enhancing mass lesion of the liver. No liver laceration. Physiologic distention of the gallbladder without stones. No biliary dilatation. Pancreas: Marked fatty atrophy of the pancreas. Small cystic lesions involving the body of the pancreas measuring 7 and 9 mm each that may represent small side branch intraductal papillary mucinous neoplasms. Spleen: Normal without laceration. Adrenals/Urinary Tract: Bilateral adrenal nodules compatible measuring up to 1.9 cm on the left and 2.4 cm on the right. Findings are indeterminate but may represent benign  adenomata which can be confirmed with nonemergent CT or MRI with adrenal mass protocol. Renal atrophy with cortical thinning is noted bilaterally. Indeterminate  hypodensity off the upper pole the left kidney measuring 7 mm is identified with a similar-sized 6 mm hypodensity off the lower pole the right kidney. These may represent simple and/or complex cysts but are too small to further characterize. Bladder diverticula are noted the largest is posterolateral on the left measuring 9.5 x 5.3 x 8.5 cm. Smaller posterior bladder diverticulum containing either mural calcification or dependent calculi/milk of calcium is noted. Stomach/Bowel: Small hiatal hernia. Nondistended stomach with normal small bowel rotation. No mural hematoma or thickening. No bowel obstruction or inflammation. The appendix is surgically absent by report. Mild circumferential thickening in the anorectal region may reflect internal hemorrhoids. Vascular/Lymphatic: Mild to moderate aortoiliac atherosclerosis without aneurysm. No lymphadenopathy. Reproductive: Enlarged prostate measuring 4.8 x 6.4 x 4.7 cm with central and peripheral zone calcifications. Other: No abdominal wall hernia or abnormality. No abdominopelvic ascites. Musculoskeletal: Remote appearing osteoporotic compression fractures with mild biconcave compressions of T4, T6, mild-to-moderate inferior endplate compressions of T9 and T10 and moderate biconcave compression fractures at T12 and L1. Grade 1 anterolisthesis associated degenerative disc disease noted at L3-4. IMPRESSION: Chest CT: 1. Acute anterolateral left sixth through ninth rib fractures with age-indeterminate anterior angulated fracture of the right second rib. Remote appearing posterior left eleventh rib fracture with callus. No associated pneumothorax. 2. Patchy left lower lobe pulmonary consolidations suspicious for pneumonia. 3. Coronary arteriosclerosis along the left circumflex. 4. Mild dilatation of the main  pulmonary artery compatible with chronic pulmonary hypertension. CT abdomen and pelvis: 1. Two small cystic lesions of the pancreatic body measuring 7 and 9 mm that may represent small side branch cystic IPMN. 2. Bilateral adrenal nodules that may reflect benign adenomata. These can be further worked up non emergently with dedicated CT or MRI with adrenal mass protocol. 3. Tiny too small to further characterize simple and complex appearing cysts of both kidneys without worrisome features. 4. Multiple bladder diverticula, the largest on the left measuring 9.5 x 5.3 x 8.5 cm. Smaller posterior bladder diverticula with milk of calcium are noted. 5. Enlarged prostate. 6. Multiple osteoporotic appearing compression fractures of the thoracolumbar spine. Grade 1 anterolisthesis of L3 on L4 associated with degenerative disc disease. Electronically Signed   By: Ashley Royalty M.D.   On: 05/02/2017 22:55   Dg Chest Port 1 View  Result Date: 05/03/2017 CLINICAL DATA:  Multiple rib fractures. EXAM: PORTABLE CHEST 1 VIEW COMPARISON:  CT 05/02/2017.  Chest x-ray 05/02/2017. FINDINGS: Mediastinum and hilar structures normal. Mitral annular calcification. Aortic calcification. Thoracic aorta is tortuous. Cardiomegaly with normal pulmonary vascularity. Bilateral pulmonary interstitial prominence. No pleural effusion or pneumothorax. No acute bony abnormality. Multiple rib fractures best identified by prior CT. IMPRESSION: 1. Cardiomegaly with mild bilateral interstitial prominence. Mild CHF may be present. Pneumonitis cannot be excluded. 2. Multiple rib fractures best identified by prior CT. No pneumothorax. Electronically Signed   By: Marcello Moores  Register   On: 05/03/2017 11:51     Labs:   Basic Metabolic Panel: Recent Labs  Lab 05/02/17 2057 05/03/17 0801  NA 139 139  K 4.1 3.9  CL 111 109  CO2 21* 21*  GLUCOSE 146* 116*  BUN 33* 27*  CREATININE 1.08 0.93  CALCIUM 8.8* 8.7*   GFR Estimated Creatinine Clearance:  56.4 mL/min (by C-G formula based on SCr of 0.93 mg/dL). Liver Function Tests: No results for input(s): AST, ALT, ALKPHOS, BILITOT, PROT, ALBUMIN in the last 168 hours. No results for input(s): LIPASE, AMYLASE in the last 168 hours. No results for  input(s): AMMONIA in the last 168 hours. Coagulation profile No results for input(s): INR, PROTIME in the last 168 hours.  CBC: Recent Labs  Lab 05/02/17 2057 05/03/17 0801  WBC 10.4 9.6  NEUTROABS 8.0* 6.8  HGB 12.5* 12.4*  HCT 36.4* 36.9*  MCV 109.6* 110.8*  PLT 198 215   Cardiac Enzymes: No results for input(s): CKTOTAL, CKMB, CKMBINDEX, TROPONINI in the last 168 hours. BNP: Invalid input(s): POCBNP CBG: Recent Labs  Lab 05/03/17 0854 05/03/17 1201  GLUCAP 108* 154*   D-Dimer No results for input(s): DDIMER in the last 72 hours. Hgb A1c No results for input(s): HGBA1C in the last 72 hours. Lipid Profile No results for input(s): CHOL, HDL, LDLCALC, TRIG, CHOLHDL, LDLDIRECT in the last 72 hours. Thyroid function studies Recent Labs    05/03/17 0801  TSH 4.173   Anemia work up No results for input(s): VITAMINB12, FOLATE, FERRITIN, TIBC, IRON, RETICCTPCT in the last 72 hours. Microbiology No results found for this or any previous visit (from the past 240 hour(s)).   Discharge Instructions:   Discharge Instructions    Diet Carb Modified   Complete by:  As directed    Discharge instructions   Complete by:  As directed    Continue incentive spirometry until seen by PCP -home health PT/OT/RN   Increase activity slowly   Complete by:  As directed      Allergies as of 05/03/2017      Reactions   Aspirin    Fentanyl Rash      Medication List    TAKE these medications   acetaminophen 325 MG tablet Commonly known as:  TYLENOL Take 2 tablets (650 mg total) by mouth every 6 (six) hours as needed for mild pain (or Fever >/= 101).   baci-polymyx-neo-hydrocort 1 % ointment Commonly known as:  CORTISPORIN Place  into the left eye 2 (two) times daily.   carbamazepine 100 MG chewable tablet Commonly known as:  TEGRETOL Chew 100 mg by mouth daily.   docusate sodium 100 MG capsule Commonly known as:  COLACE Take 100 mg by mouth daily.   DULoxetine 30 MG capsule Commonly known as:  CYMBALTA Take 30 mg by mouth daily.   HYDROcodone-acetaminophen 5-325 MG tablet Commonly known as:  NORCO/VICODIN Take 1 tablet by mouth every 6 (six) hours as needed for severe pain.   metFORMIN 500 MG tablet Commonly known as:  GLUCOPHAGE Take 500 mg by mouth 2 (two) times daily with a meal.   neomycin-polymyxin b-dexamethasone 3.5-10000-0.1 Oint Commonly known as:  MAXITROL Place 1 application into the left eye.   omeprazole 20 MG capsule Commonly known as:  PRILOSEC Take 20 mg by mouth daily.   psyllium 0.52 g capsule Commonly known as:  REGULOID Take 0.52 g by mouth daily.   tamsulosin 0.4 MG Caps capsule Commonly known as:  FLOMAX Take 0.4 mg by mouth daily.      Follow-up Information    [facility].   [facility] Novak Contact information: [place] [address] [phone]        Terry Leach, MD Follow up in 1 week(s).   Specialty:  Internal Medicine Contact information: [address] [place] [address] [phone]            Time coordinating discharge: 35 min  Signed:  Geradine Girt   [facility] 05/03/2017, 2:22 PM

## 2017-05-03 NOTE — H&P (Signed)
History and Physical    Terry Harrell VZD:[id number] DOB: 02-20-1927 DOA: 05/02/2017  PCP: Drake Leach, MD  Patient coming from:.  Home.  Chief Complaint: Fall.  HPI: Terry Harrell is a 84 y.o. male with history of diabetes mellitus type, caustic neuroma, trigeminal neuralgia, difficulty hearing had a fall at home while trying to reach his walker.  Patient on fall he hit his left side of the chest denies hitting his head or losing consciousness.  Due to persistent pain patient was brought to the ER.  ED Course: In the ER patient had CT head CT of the chest and abdomen.  Shows fractures involving the left sixth through ninth rib.  There also was a consolidation.  But patient did not have any signs or symptoms for pneumonia.  On-call trauma surgeon Dr. Ninfa Linden was consulted and at this time is requested hospitalist admission.  On my exam patient is not in any distress.  His pain is largely controlled after receiving morphine and hydrocodone.  Review of Systems: As per HPI, rest all negative.   Past Medical History:  Diagnosis Date  . Acoustic neuroma ([place])   . Barretts esophagus   . Depression   . Diabetes mellitus without complication ([place])   . Edema   . GERD (gastroesophageal reflux disease)   . Kidney infection   . Neuropathy   . Spinal stenosis   . Trigeminal neuralgia     Past Surgical History:  Procedure Laterality Date  . APPENDECTOMY    . BRAIN SURGERY    . HERNIA REPAIR       reports that he has quit smoking. He has never used smokeless tobacco. He reports that he does not drink alcohol or use drugs.  Allergies  Allergen Reactions  . Aspirin   . Fentanyl Rash    Family History  Problem Relation Age of Onset  . Hypertension Other     Prior to Admission medications   Medication Sig Start Date End Date Taking? Authorizing Provider  baci-polymyx-neo-hydrocort (CORTISPORIN) 1 % ointment Place into the left eye 2 (two) times daily.   Yes [provider]  carbamazepine (TEGRETOL) 100 MG chewable tablet Chew 100 mg by mouth daily.   Yes [provider]  docusate sodium (COLACE) 100 MG capsule Take 100 mg by mouth daily.   Yes [provider]  DULoxetine (CYMBALTA) 30 MG capsule Take 30 mg by mouth daily.   Yes [provider]  metFORMIN (GLUCOPHAGE) 500 MG tablet Take 500 mg by mouth 2 (two) times daily with a meal.   Yes [provider]  neomycin-polymyxin b-dexamethasone (MAXITROL) 3.5-10000-0.1 OINT Place 1 application into the left eye.   Yes [provider]  omeprazole (PRILOSEC) 20 MG capsule Take 20 mg by mouth daily.   Yes [provider]  psyllium (REGULOID) 0.52 g capsule Take 0.52 g by mouth daily.   Yes [provider]  tamsulosin (FLOMAX) 0.4 MG CAPS capsule Take 0.4 mg by mouth.   Yes [provider]    Physical Exam: Vitals:   05/03/17 0015 05/03/17 0030 05/03/17 0240 05/03/17 0503  BP:  138/81 138/70 134/86  Pulse:   (!) 55 (!) 56  Resp: 17 14 16 16   Temp:   98.2 F (36.8 C) 97.9 F (36.6 C)  TempSrc:   Oral Oral  SpO2:   95% 93%  Weight:   71.2 kg (157 lb)   Height:          Constitutional:  Moderately built and nourished. Vitals:   05/03/17 0015 05/03/17 0030 05/03/17 0240 05/03/17 0503  BP:  138/81 138/70 134/86  Pulse:   (!) 55 (!) 56  Resp: 17 14 16 16   Temp:   98.2 F (36.8 C) 97.9 F (36.6 C)  TempSrc:   Oral Oral  SpO2:   95% 93%  Weight:   71.2 kg (157 lb)   Height:       Eyes: Anicteric no pallor. ENMT: No discharge from the ears eyes nose or mouth. Neck: No mass felt.  No neck rigidity. Respiratory: No rhonchi or crepitations. Cardiovascular: S1-S2 heard no murmurs appreciated. Abdomen: Soft nontender bowel sounds present. Musculoskeletal: No edema no joint effusion. Skin: No rash. Neurologic: Alert awake oriented to time place and person.  Moves all extremities. Psychiatric: Appears normal.  Normal affect.   Labs  on Admission: I have personally reviewed following labs and imaging studies  CBC: Recent Labs  Lab 05/02/17 2057  WBC 10.4  NEUTROABS 8.0*  HGB 12.5*  HCT 36.4*  MCV 109.6*  PLT 588   Basic Metabolic Panel: Recent Labs  Lab 05/02/17 2057  NA 139  K 4.1  CL 111  CO2 21*  GLUCOSE 146*  BUN 33*  CREATININE 1.08  CALCIUM 8.8*   GFR: Estimated Creatinine Clearance: 48.5 mL/min (by C-G formula based on SCr of 1.08 mg/dL). Liver Function Tests: No results for input(s): AST, ALT, ALKPHOS, BILITOT, PROT, ALBUMIN in the last 168 hours. No results for input(s): LIPASE, AMYLASE in the last 168 hours. No results for input(s): AMMONIA in the last 168 hours. Coagulation Profile: No results for input(s): INR, PROTIME in the last 168 hours. Cardiac Enzymes: No results for input(s): CKTOTAL, CKMB, CKMBINDEX, TROPONINI in the last 168 hours. BNP (last 3 results) No results for input(s): PROBNP in the last 8760 hours. HbA1C: No results for input(s): HGBA1C in the last 72 hours. CBG: No results for input(s): GLUCAP in the last 168 hours. Lipid Profile: No results for input(s): CHOL, HDL, LDLCALC, TRIG, CHOLHDL, LDLDIRECT in the last 72 hours. Thyroid Function Tests: No results for input(s): TSH, T4TOTAL, FREET4, T3FREE, THYROIDAB in the last 72 hours. Anemia Panel: No results for input(s): VITAMINB12, FOLATE, FERRITIN, TIBC, IRON, RETICCTPCT in the last 72 hours. Urine analysis: No results found for: COLORURINE, APPEARANCEUR, LABSPEC, PHURINE, GLUCOSEU, HGBUR, BILIRUBINUR, KETONESUR, PROTEINUR, UROBILINOGEN, NITRITE, LEUKOCYTESUR Sepsis Labs: @LABRCNTIP (procalcitonin:4,lacticidven:4) )No results found for this or any previous visit (from the past 240 hour(s)).   Radiological Exams on Admission: Dg Ribs Unilateral W/chest Left  Result Date: 05/02/2017 CLINICAL DATA:  Status post fall.  Left lower anterior rib pain EXAM: LEFT RIBS AND CHEST - 3+ VIEW COMPARISON:  None. FINDINGS: No  fracture or other bone lesions are seen involving the ribs. There is no evidence of pneumothorax or pleural effusion. Mild right basilar atelectasis. Hazy left lower lobe airspace disease which may reflect atelectasis versus pneumonia. Heart size and mediastinal contours are within normal limits. IMPRESSION: 1. No acute injury of the left ribs. 2. Mild right basilar atelectasis. Hazy left lower lobe airspace disease which may reflect atelectasis versus pneumonia. Electronically Signed   By: Kathreen Devoid   On: 05/02/2017 20:33   Ct Head Wo Contrast  Result Date: 05/02/2017 CLINICAL DATA:  Head trauma, status post fall. EXAM: CT HEAD WITHOUT CONTRAST TECHNIQUE: Contiguous axial images were obtained from the base of the skull through the vertex without intravenous contrast. COMPARISON:  None. FINDINGS: Brain: No evidence of acute infarction,  hemorrhage, extra-axial collection, ventriculomegaly, or mass effect. Large area of right frontal lobe encephalomalacia. Generalized cerebral atrophy. Periventricular white matter low attenuation likely secondary to microangiopathy. Vascular: Cerebrovascular atherosclerotic calcifications are noted. Skull: Prior right frontotemporal craniotomy. No acute osseous abnormality. Sinuses/Orbits: Visualized portions of the orbits are unremarkable. Visualized portions of the paranasal sinuses and mastoid air cells are unremarkable. Other: None. IMPRESSION: No acute intracranial pathology. Electronically Signed   By: Kathreen Devoid   On: 05/02/2017 20:24   Ct Chest W Contrast  Result Date: 05/02/2017 CLINICAL DATA:  Patient fell tonight and presents with left lower to anterior rib pain. EXAM: CT CHEST, ABDOMEN, AND PELVIS WITH CONTRAST TECHNIQUE: Multidetector CT imaging of the chest, abdomen and pelvis was performed following the standard protocol during bolus administration of intravenous contrast. CONTRAST:  80 cc Isovue-300 COMPARISON:  None. FINDINGS: CT CHEST FINDINGS  Cardiovascular: Normal branch pattern of the great vessels without stenosis. Aortic atherosclerosis with ectasia. No dissection. No evidence of mediastinal hematoma. Dilated main pulmonary artery to 3.7 cm consistent with chronic pulmonary hypertension. Heart is enlarged with trace pericardial effusion posteriorly on the left. Coronary arterial calcifications are identified left circumflex. No large central pulmonary embolus is noted though the study is not tailored for the assessment of embolus. Mediastinum/Nodes: Patent trachea and mainstem bronchi. No mediastinal nor hilar adenopathy. No thyromegaly or mass. Small amount of fluid in the distal esophagus associated small hiatal hernia. Lungs/Pleura: Bilateral dependent atelectasis. Peribronchial thickening is noted bilaterally to the lower lobes in particular. Patchy pneumonic consolidations seen in the left lower lobe. Trace fluid in the left major fissure. Musculoskeletal: Acute anterolateral left sixth through ninth rib fractures with chronic healed posterior left eleventh rib fracture is identified. Angulated fracture, remote in appearance involving the right second anterior rib. Small cartilaginous rests along the lateral aspect of the right eighth rib. CT ABDOMEN PELVIS FINDINGS Hepatobiliary: Colonic interposition between the liver and ventral abdominal wall. No enhancing mass lesion of the liver. No liver laceration. Physiologic distention of the gallbladder without stones. No biliary dilatation. Pancreas: Marked fatty atrophy of the pancreas. Small cystic lesions involving the body of the pancreas measuring 7 and 9 mm each that may represent small side branch intraductal papillary mucinous neoplasms. Spleen: Normal without laceration. Adrenals/Urinary Tract: Bilateral adrenal nodules compatible measuring up to 1.9 cm on the left and 2.4 cm on the right. Findings are indeterminate but may represent benign adenomata which can be confirmed with nonemergent  CT or MRI with adrenal mass protocol. Renal atrophy with cortical thinning is noted bilaterally. Indeterminate hypodensity off the upper pole the left kidney measuring 7 mm is identified with a similar-sized 6 mm hypodensity off the lower pole the right kidney. These may represent simple and/or complex cysts but are too small to further characterize. Bladder diverticula are noted the largest is posterolateral on the left measuring 9.5 x 5.3 x 8.5 cm. Smaller posterior bladder diverticulum containing either mural calcification or dependent calculi/milk of calcium is noted. Stomach/Bowel: Small hiatal hernia. Nondistended stomach with normal small bowel rotation. No mural hematoma or thickening. No bowel obstruction or inflammation. The appendix is surgically absent by report. Mild circumferential thickening in the anorectal region may reflect internal hemorrhoids. Vascular/Lymphatic: Mild to moderate aortoiliac atherosclerosis without aneurysm. No lymphadenopathy. Reproductive: Enlarged prostate measuring 4.8 x 6.4 x 4.7 cm with central and peripheral zone calcifications. Other: No abdominal wall hernia or abnormality. No abdominopelvic ascites. Musculoskeletal: Remote appearing osteoporotic compression fractures with mild biconcave compressions of T4, T6,  mild-to-moderate inferior endplate compressions of T9 and T10 and moderate biconcave compression fractures at T12 and L1. Grade 1 anterolisthesis associated degenerative disc disease noted at L3-4. IMPRESSION: Chest CT: 1. Acute anterolateral left sixth through ninth rib fractures with age-indeterminate anterior angulated fracture of the right second rib. Remote appearing posterior left eleventh rib fracture with callus. No associated pneumothorax. 2. Patchy left lower lobe pulmonary consolidations suspicious for pneumonia. 3. Coronary arteriosclerosis along the left circumflex. 4. Mild dilatation of the main pulmonary artery compatible with chronic pulmonary  hypertension. CT abdomen and pelvis: 1. Two small cystic lesions of the pancreatic body measuring 7 and 9 mm that may represent small side branch cystic IPMN. 2. Bilateral adrenal nodules that may reflect benign adenomata. These can be further worked up non emergently with dedicated CT or MRI with adrenal mass protocol. 3. Tiny too small to further characterize simple and complex appearing cysts of both kidneys without worrisome features. 4. Multiple bladder diverticula, the largest on the left measuring 9.5 x 5.3 x 8.5 cm. Smaller posterior bladder diverticula with milk of calcium are noted. 5. Enlarged prostate. 6. Multiple osteoporotic appearing compression fractures of the thoracolumbar spine. Grade 1 anterolisthesis of L3 on L4 associated with degenerative disc disease. Electronically Signed   By: Ashley Royalty M.D.   On: 05/02/2017 22:55   Ct Abdomen Pelvis W Contrast  Result Date: 05/02/2017 CLINICAL DATA:  Patient fell tonight and presents with left lower to anterior rib pain. EXAM: CT CHEST, ABDOMEN, AND PELVIS WITH CONTRAST TECHNIQUE: Multidetector CT imaging of the chest, abdomen and pelvis was performed following the standard protocol during bolus administration of intravenous contrast. CONTRAST:  80 cc Isovue-300 COMPARISON:  None. FINDINGS: CT CHEST FINDINGS Cardiovascular: Normal branch pattern of the great vessels without stenosis. Aortic atherosclerosis with ectasia. No dissection. No evidence of mediastinal hematoma. Dilated main pulmonary artery to 3.7 cm consistent with chronic pulmonary hypertension. Heart is enlarged with trace pericardial effusion posteriorly on the left. Coronary arterial calcifications are identified left circumflex. No large central pulmonary embolus is noted though the study is not tailored for the assessment of embolus. Mediastinum/Nodes: Patent trachea and mainstem bronchi. No mediastinal nor hilar adenopathy. No thyromegaly or mass. Small amount of fluid in the distal  esophagus associated small hiatal hernia. Lungs/Pleura: Bilateral dependent atelectasis. Peribronchial thickening is noted bilaterally to the lower lobes in particular. Patchy pneumonic consolidations seen in the left lower lobe. Trace fluid in the left major fissure. Musculoskeletal: Acute anterolateral left sixth through ninth rib fractures with chronic healed posterior left eleventh rib fracture is identified. Angulated fracture, remote in appearance involving the right second anterior rib. Small cartilaginous rests along the lateral aspect of the right eighth rib. CT ABDOMEN PELVIS FINDINGS Hepatobiliary: Colonic interposition between the liver and ventral abdominal wall. No enhancing mass lesion of the liver. No liver laceration. Physiologic distention of the gallbladder without stones. No biliary dilatation. Pancreas: Marked fatty atrophy of the pancreas. Small cystic lesions involving the body of the pancreas measuring 7 and 9 mm each that may represent small side branch intraductal papillary mucinous neoplasms. Spleen: Normal without laceration. Adrenals/Urinary Tract: Bilateral adrenal nodules compatible measuring up to 1.9 cm on the left and 2.4 cm on the right. Findings are indeterminate but may represent benign adenomata which can be confirmed with nonemergent CT or MRI with adrenal mass protocol. Renal atrophy with cortical thinning is noted bilaterally. Indeterminate hypodensity off the upper pole the left kidney measuring 7 mm is identified with a  similar-sized 6 mm hypodensity off the lower pole the right kidney. These may represent simple and/or complex cysts but are too small to further characterize. Bladder diverticula are noted the largest is posterolateral on the left measuring 9.5 x 5.3 x 8.5 cm. Smaller posterior bladder diverticulum containing either mural calcification or dependent calculi/milk of calcium is noted. Stomach/Bowel: Small hiatal hernia. Nondistended stomach with normal small  bowel rotation. No mural hematoma or thickening. No bowel obstruction or inflammation. The appendix is surgically absent by report. Mild circumferential thickening in the anorectal region may reflect internal hemorrhoids. Vascular/Lymphatic: Mild to moderate aortoiliac atherosclerosis without aneurysm. No lymphadenopathy. Reproductive: Enlarged prostate measuring 4.8 x 6.4 x 4.7 cm with central and peripheral zone calcifications. Other: No abdominal wall hernia or abnormality. No abdominopelvic ascites. Musculoskeletal: Remote appearing osteoporotic compression fractures with mild biconcave compressions of T4, T6, mild-to-moderate inferior endplate compressions of T9 and T10 and moderate biconcave compression fractures at T12 and L1. Grade 1 anterolisthesis associated degenerative disc disease noted at L3-4. IMPRESSION: Chest CT: 1. Acute anterolateral left sixth through ninth rib fractures with age-indeterminate anterior angulated fracture of the right second rib. Remote appearing posterior left eleventh rib fracture with callus. No associated pneumothorax. 2. Patchy left lower lobe pulmonary consolidations suspicious for pneumonia. 3. Coronary arteriosclerosis along the left circumflex. 4. Mild dilatation of the main pulmonary artery compatible with chronic pulmonary hypertension. CT abdomen and pelvis: 1. Two small cystic lesions of the pancreatic body measuring 7 and 9 mm that may represent small side branch cystic IPMN. 2. Bilateral adrenal nodules that may reflect benign adenomata. These can be further worked up non emergently with dedicated CT or MRI with adrenal mass protocol. 3. Tiny too small to further characterize simple and complex appearing cysts of both kidneys without worrisome features. 4. Multiple bladder diverticula, the largest on the left measuring 9.5 x 5.3 x 8.5 cm. Smaller posterior bladder diverticula with milk of calcium are noted. 5. Enlarged prostate. 6. Multiple osteoporotic appearing  compression fractures of the thoracolumbar spine. Grade 1 anterolisthesis of L3 on L4 associated with degenerative disc disease. Electronically Signed   By: Ashley Royalty M.D.   On: 05/02/2017 22:55    EKG: Independently reviewed.  Sinus bradycardia.  Assessment/Plan Principal Problem:   Fall Active Problems:   GERD (gastroesophageal reflux disease)   Diabetes mellitus without complication ([facility])   Trigeminal neuralgia   Left rib fracture   BPH (benign prostatic hyperplasia)    1. Fall with left-sided rib fractures involving the sixth through ninth -patient placed on incentive spirometry and pain relief medication.  We will get physical therapy consult.  May discuss the trauma surgeon in a.m.  Will repeat chest x-ray in a.m. 2. Consolidation seen in CT chest -no definite signs of pneumonia no antibiotics placed at this time. 3. Sinus bradycardia -we will check TSH and monitor in telemetry. 4. Diabetes mellitus type 2 -will place patient on sliding scale coverage while inpatient. 5. Trigeminal neuralgia on Tegretol.  Check Tegretol levels. 6. History of acoustic neuroma. 7. Hard of hearing.  8. Macrocytic anemia -if there is no significant fall in hemoglobin then further workup as outpatient.   DVT prophylaxis: Heparin. Code Status: Full code. Family Communication: No family at the bedside. Disposition Plan: To be determined. Consults called: Physical therapy. Admission status: Observation.   Rise Patience MD [facility] Pager [phone].  If 7PM-7AM, please contact night-coverage [email or web address] Password [facility] [employer]  05/03/2017, 5:54 AM

## 2017-05-03 NOTE — Progress Notes (Signed)
Pt was discharged to go home with home health for PT/OT/RN.  Discharge instructions and prescriptions  given.

## 2017-05-03 NOTE — Consult Note (Signed)
Reason for Consult:rib fractures Referring Physician: Dr. Normand Sloop Terry Harrell is an 84 y.o. male.  HPI: This gentleman was transferred from [facility].  He is an 84 year old gentleman who presented with a ground-level fall.  This occurred early yesterday afternoon.  He was trying to grab his walker when he slipped and fell hitting his left side on a counter.  He presented to the ER where he was found to have multiple left rib fractures.  He is fairly hard of hearing.  He denies shortness of breath.  He denied loss of consciousness.  He also denies neck pain or abdominal pain  Past Medical History:  Diagnosis Date  . Acoustic neuroma ([place])   . Barretts esophagus   . Depression   . Diabetes mellitus without complication ([place])   . Edema   . GERD (gastroesophageal reflux disease)   . Kidney infection   . Neuropathy   . Spinal stenosis   . Trigeminal neuralgia     Past Surgical History:  Procedure Laterality Date  . APPENDECTOMY    . BRAIN SURGERY    . HERNIA REPAIR      Family History  Problem Relation Age of Onset  . Hypertension Other     Social History:  reports that he has quit smoking. He has never used smokeless tobacco. He reports that he does not drink alcohol or use drugs.  Allergies:  Allergies  Allergen Reactions  . Aspirin   . Fentanyl Rash    Medications: I have reviewed the patient's current medications.  Results for orders placed or performed during the hospital encounter of 05/02/17 (from the past 48 hour(s))  Basic metabolic panel     Status: Abnormal   Collection Time: 05/02/17  8:57 PM  Result Value Ref Range   Sodium 139 135 - 145 mmol/L   Potassium 4.1 3.5 - 5.1 mmol/L   Chloride 111 101 - 111 mmol/L   CO2 21 (L) 22 - 32 mmol/L   Glucose, Bld 146 (H) 65 - 99 mg/dL   BUN 33 (H) 6 - 20 mg/dL   Creatinine, Ser 1.08 0.61 - 1.24 mg/dL   Calcium 8.8 (L) 8.9 - 10.3 mg/dL   GFR calc non Af Amer 60 (L) >60 mL/min   GFR calc Af Amer >60  >60 mL/min    Comment: (NOTE) The eGFR has been calculated using the CKD EPI equation. This calculation has not been validated in all clinical situations. eGFR's persistently <60 mL/min signify possible Chronic Kidney Disease.    Anion gap 7 5 - 15    Comment: Performed at [facility], [address] [place], [place] [address]  CBC with Differential     Status: Abnormal   Collection Time: 05/02/17  8:57 PM  Result Value Ref Range   WBC 10.4 4.0 - 10.5 K/uL    Comment: WHITE COUNT CONFIRMED ON SMEAR   RBC 3.32 (L) 4.22 - 5.81 MIL/uL   Hemoglobin 12.5 (L) 13.0 - 17.0 g/dL   HCT 36.4 (L) 39.0 - 52.0 %   MCV 109.6 (H) 78.0 - 100.0 fL   MCH 37.7 (H) 26.0 - 34.0 pg   MCHC 34.3 30.0 - 36.0 g/dL   RDW 13.7 11.5 - 15.5 %   Platelets 198 150 - 400 K/uL   Neutrophils Relative % 77 %   Lymphocytes Relative 16 %   Monocytes Relative 7 %   Eosinophils Relative 0 %   Basophils Relative 0 %  Neutro Abs 8.0 (H) 1.7 - 7.7 K/uL   Lymphs Abs 1.7 0.7 - 4.0 K/uL   Monocytes Absolute 0.7 0.1 - 1.0 K/uL   Eosinophils Absolute 0.0 0.0 - 0.7 K/uL   Basophils Absolute 0.0 0.0 - 0.1 K/uL   WBC Morphology ATYPICAL LYMPHOCYTES     Comment: Performed at [facility], [address] [place], [place] [address]    Dg Ribs Unilateral W/chest Left  Result Date: 05/02/2017 CLINICAL DATA:  Status post fall.  Left lower anterior rib pain EXAM: LEFT RIBS AND CHEST - 3+ VIEW COMPARISON:  None. FINDINGS: No fracture or other bone lesions are seen involving the ribs. There is no evidence of pneumothorax or pleural effusion. Mild right basilar atelectasis. Hazy left lower lobe airspace disease which may reflect atelectasis versus pneumonia. Heart size and mediastinal contours are within normal limits. IMPRESSION: 1. No acute injury of the left ribs. 2. Mild right basilar atelectasis. Hazy left lower lobe airspace disease which may reflect atelectasis versus pneumonia. Electronically Signed   By:  Kathreen Devoid   On: 05/02/2017 20:33   Ct Head Wo Contrast  Result Date: 05/02/2017 CLINICAL DATA:  Head trauma, status post fall. EXAM: CT HEAD WITHOUT CONTRAST TECHNIQUE: Contiguous axial images were obtained from the base of the skull through the vertex without intravenous contrast. COMPARISON:  None. FINDINGS: Brain: No evidence of acute infarction, hemorrhage, extra-axial collection, ventriculomegaly, or mass effect. Large area of right frontal lobe encephalomalacia. Generalized cerebral atrophy. Periventricular white matter low attenuation likely secondary to microangiopathy. Vascular: Cerebrovascular atherosclerotic calcifications are noted. Skull: Prior right frontotemporal craniotomy. No acute osseous abnormality. Sinuses/Orbits: Visualized portions of the orbits are unremarkable. Visualized portions of the paranasal sinuses and mastoid air cells are unremarkable. Other: None. IMPRESSION: No acute intracranial pathology. Electronically Signed   By: Kathreen Devoid   On: 05/02/2017 20:24   Ct Chest W Contrast  Result Date: 05/02/2017 CLINICAL DATA:  Patient fell tonight and presents with left lower to anterior rib pain. EXAM: CT CHEST, ABDOMEN, AND PELVIS WITH CONTRAST TECHNIQUE: Multidetector CT imaging of the chest, abdomen and pelvis was performed following the standard protocol during bolus administration of intravenous contrast. CONTRAST:  80 cc Isovue-300 COMPARISON:  None. FINDINGS: CT CHEST FINDINGS Cardiovascular: Normal branch pattern of the great vessels without stenosis. Aortic atherosclerosis with ectasia. No dissection. No evidence of mediastinal hematoma. Dilated main pulmonary artery to 3.7 cm consistent with chronic pulmonary hypertension. Heart is enlarged with trace pericardial effusion posteriorly on the left. Coronary arterial calcifications are identified left circumflex. No large central pulmonary embolus is noted though the study is not tailored for the assessment of embolus.  Mediastinum/Nodes: Patent trachea and mainstem bronchi. No mediastinal nor hilar adenopathy. No thyromegaly or mass. Small amount of fluid in the distal esophagus associated small hiatal hernia. Lungs/Pleura: Bilateral dependent atelectasis. Peribronchial thickening is noted bilaterally to the lower lobes in particular. Patchy pneumonic consolidations seen in the left lower lobe. Trace fluid in the left major fissure. Musculoskeletal: Acute anterolateral left sixth through ninth rib fractures with chronic healed posterior left eleventh rib fracture is identified. Angulated fracture, remote in appearance involving the right second anterior rib. Small cartilaginous rests along the lateral aspect of the right eighth rib. CT ABDOMEN PELVIS FINDINGS Hepatobiliary: Colonic interposition between the liver and ventral abdominal wall. No enhancing mass lesion of the liver. No liver laceration. Physiologic distention of the gallbladder without stones. No biliary dilatation. Pancreas: Marked fatty atrophy of the pancreas.  Small cystic lesions involving the body of the pancreas measuring 7 and 9 mm each that may represent small side branch intraductal papillary mucinous neoplasms. Spleen: Normal without laceration. Adrenals/Urinary Tract: Bilateral adrenal nodules compatible measuring up to 1.9 cm on the left and 2.4 cm on the right. Findings are indeterminate but may represent benign adenomata which can be confirmed with nonemergent CT or MRI with adrenal mass protocol. Renal atrophy with cortical thinning is noted bilaterally. Indeterminate hypodensity off the upper pole the left kidney measuring 7 mm is identified with a similar-sized 6 mm hypodensity off the lower pole the right kidney. These may represent simple and/or complex cysts but are too small to further characterize. Bladder diverticula are noted the largest is posterolateral on the left measuring 9.5 x 5.3 x 8.5 cm. Smaller posterior bladder diverticulum  containing either mural calcification or dependent calculi/milk of calcium is noted. Stomach/Bowel: Small hiatal hernia. Nondistended stomach with normal small bowel rotation. No mural hematoma or thickening. No bowel obstruction or inflammation. The appendix is surgically absent by report. Mild circumferential thickening in the anorectal region may reflect internal hemorrhoids. Vascular/Lymphatic: Mild to moderate aortoiliac atherosclerosis without aneurysm. No lymphadenopathy. Reproductive: Enlarged prostate measuring 4.8 x 6.4 x 4.7 cm with central and peripheral zone calcifications. Other: No abdominal wall hernia or abnormality. No abdominopelvic ascites. Musculoskeletal: Remote appearing osteoporotic compression fractures with mild biconcave compressions of T4, T6, mild-to-moderate inferior endplate compressions of T9 and T10 and moderate biconcave compression fractures at T12 and L1. Grade 1 anterolisthesis associated degenerative disc disease noted at L3-4. IMPRESSION: Chest CT: 1. Acute anterolateral left sixth through ninth rib fractures with age-indeterminate anterior angulated fracture of the right second rib. Remote appearing posterior left eleventh rib fracture with callus. No associated pneumothorax. 2. Patchy left lower lobe pulmonary consolidations suspicious for pneumonia. 3. Coronary arteriosclerosis along the left circumflex. 4. Mild dilatation of the main pulmonary artery compatible with chronic pulmonary hypertension. CT abdomen and pelvis: 1. Two small cystic lesions of the pancreatic body measuring 7 and 9 mm that may represent small side branch cystic IPMN. 2. Bilateral adrenal nodules that may reflect benign adenomata. These can be further worked up non emergently with dedicated CT or MRI with adrenal mass protocol. 3. Tiny too small to further characterize simple and complex appearing cysts of both kidneys without worrisome features. 4. Multiple bladder diverticula, the largest on the left  measuring 9.5 x 5.3 x 8.5 cm. Smaller posterior bladder diverticula with milk of calcium are noted. 5. Enlarged prostate. 6. Multiple osteoporotic appearing compression fractures of the thoracolumbar spine. Grade 1 anterolisthesis of L3 on L4 associated with degenerative disc disease. Electronically Signed   By: Ashley Royalty M.D.   On: 05/02/2017 22:55   Ct Abdomen Pelvis W Contrast  Result Date: 05/02/2017 CLINICAL DATA:  Patient fell tonight and presents with left lower to anterior rib pain. EXAM: CT CHEST, ABDOMEN, AND PELVIS WITH CONTRAST TECHNIQUE: Multidetector CT imaging of the chest, abdomen and pelvis was performed following the standard protocol during bolus administration of intravenous contrast. CONTRAST:  80 cc Isovue-300 COMPARISON:  None. FINDINGS: CT CHEST FINDINGS Cardiovascular: Normal branch pattern of the great vessels without stenosis. Aortic atherosclerosis with ectasia. No dissection. No evidence of mediastinal hematoma. Dilated main pulmonary artery to 3.7 cm consistent with chronic pulmonary hypertension. Heart is enlarged with trace pericardial effusion posteriorly on the left. Coronary arterial calcifications are identified left circumflex. No large central pulmonary embolus is noted though the study is not tailored  for the assessment of embolus. Mediastinum/Nodes: Patent trachea and mainstem bronchi. No mediastinal nor hilar adenopathy. No thyromegaly or mass. Small amount of fluid in the distal esophagus associated small hiatal hernia. Lungs/Pleura: Bilateral dependent atelectasis. Peribronchial thickening is noted bilaterally to the lower lobes in particular. Patchy pneumonic consolidations seen in the left lower lobe. Trace fluid in the left major fissure. Musculoskeletal: Acute anterolateral left sixth through ninth rib fractures with chronic healed posterior left eleventh rib fracture is identified. Angulated fracture, remote in appearance involving the right second anterior rib.  Small cartilaginous rests along the lateral aspect of the right eighth rib. CT ABDOMEN PELVIS FINDINGS Hepatobiliary: Colonic interposition between the liver and ventral abdominal wall. No enhancing mass lesion of the liver. No liver laceration. Physiologic distention of the gallbladder without stones. No biliary dilatation. Pancreas: Marked fatty atrophy of the pancreas. Small cystic lesions involving the body of the pancreas measuring 7 and 9 mm each that may represent small side branch intraductal papillary mucinous neoplasms. Spleen: Normal without laceration. Adrenals/Urinary Tract: Bilateral adrenal nodules compatible measuring up to 1.9 cm on the left and 2.4 cm on the right. Findings are indeterminate but may represent benign adenomata which can be confirmed with nonemergent CT or MRI with adrenal mass protocol. Renal atrophy with cortical thinning is noted bilaterally. Indeterminate hypodensity off the upper pole the left kidney measuring 7 mm is identified with a similar-sized 6 mm hypodensity off the lower pole the right kidney. These may represent simple and/or complex cysts but are too small to further characterize. Bladder diverticula are noted the largest is posterolateral on the left measuring 9.5 x 5.3 x 8.5 cm. Smaller posterior bladder diverticulum containing either mural calcification or dependent calculi/milk of calcium is noted. Stomach/Bowel: Small hiatal hernia. Nondistended stomach with normal small bowel rotation. No mural hematoma or thickening. No bowel obstruction or inflammation. The appendix is surgically absent by report. Mild circumferential thickening in the anorectal region may reflect internal hemorrhoids. Vascular/Lymphatic: Mild to moderate aortoiliac atherosclerosis without aneurysm. No lymphadenopathy. Reproductive: Enlarged prostate measuring 4.8 x 6.4 x 4.7 cm with central and peripheral zone calcifications. Other: No abdominal wall hernia or abnormality. No abdominopelvic  ascites. Musculoskeletal: Remote appearing osteoporotic compression fractures with mild biconcave compressions of T4, T6, mild-to-moderate inferior endplate compressions of T9 and T10 and moderate biconcave compression fractures at T12 and L1. Grade 1 anterolisthesis associated degenerative disc disease noted at L3-4. IMPRESSION: Chest CT: 1. Acute anterolateral left sixth through ninth rib fractures with age-indeterminate anterior angulated fracture of the right second rib. Remote appearing posterior left eleventh rib fracture with callus. No associated pneumothorax. 2. Patchy left lower lobe pulmonary consolidations suspicious for pneumonia. 3. Coronary arteriosclerosis along the left circumflex. 4. Mild dilatation of the main pulmonary artery compatible with chronic pulmonary hypertension. CT abdomen and pelvis: 1. Two small cystic lesions of the pancreatic body measuring 7 and 9 mm that may represent small side branch cystic IPMN. 2. Bilateral adrenal nodules that may reflect benign adenomata. These can be further worked up non emergently with dedicated CT or MRI with adrenal mass protocol. 3. Tiny too small to further characterize simple and complex appearing cysts of both kidneys without worrisome features. 4. Multiple bladder diverticula, the largest on the left measuring 9.5 x 5.3 x 8.5 cm. Smaller posterior bladder diverticula with milk of calcium are noted. 5. Enlarged prostate. 6. Multiple osteoporotic appearing compression fractures of the thoracolumbar spine. Grade 1 anterolisthesis of L3 on L4 associated with degenerative disc disease.  Electronically Signed   By: Ashley Royalty M.D.   On: 05/02/2017 22:55    Review of Systems  Respiratory: Negative for shortness of breath.   Cardiovascular: Positive for chest pain.  Gastrointestinal: Negative for abdominal pain.  Musculoskeletal: Negative for neck pain.   Blood pressure 134/86, pulse (!) 56, temperature 97.9 F (36.6 C), temperature source Oral,  resp. rate 16, height 5' 10"  (1.778 m), weight 71.2 kg (157 lb), SpO2 93 %. Physical Exam  Constitutional: He is oriented to person, place, and time. He appears well-developed and well-nourished. No distress.  HENT:  Head: Normocephalic and atraumatic.  Right Ear: External ear normal.  Left Ear: External ear normal.  Nose: Nose normal.  Mouth/Throat: Oropharynx is clear and moist. No oropharyngeal exudate.  Eyes: Pupils are equal, round, and reactive to light. Right eye exhibits no discharge. Left eye exhibits no discharge. No scleral icterus.  Neck: Normal range of motion. No tracheal deviation present.  Cardiovascular: Normal rate, regular rhythm and normal heart sounds.  No murmur heard. Respiratory: Effort normal and breath sounds normal. No respiratory distress. He exhibits tenderness.  There is left lower lateral chest wall tenderness  GI: Soft. He exhibits no distension. There is no tenderness.  Musculoskeletal: Normal range of motion. He exhibits no tenderness or deformity.  Neurological: He is alert and oriented to person, place, and time.  Skin: Skin is warm and dry. He is not diaphoretic.  Psychiatric: His behavior is normal. Judgment normal.    Assessment/Plan: Elderly gentleman status post fall with multiple left rib fractures   CT scan of the chest is otherwise unremarkable.  There are multiple chronic findings on the CAT scan of his abdomen and pelvis.  There are multiple thoracolumbar spinal compression fractures which are probably old. His biggest issue remains pain control and pulmonary toilet.  We will repeat his x-ray of the chest.  We will have physical therapy see him as well.  He will continue incentive spirometer and pain management to hopefully avoid pneumonia or any respiratory issues.     Delainee Tramel A 05/03/2017, 6:04 AM

## 2017-05-03 NOTE — Evaluation (Signed)
Physical Therapy Evaluation Patient Details Name: Terry Harrell MRN: [id number] DOB: [birth year] Today's Date: 05/03/2017   History of Present Illness  84 y.o. male admitted on 05/02/17 with fall and L sided rib fractures 6-9.  Pt with significant PMH of  trigeminal neuralgia, spinal stenosis, neuropathy (with drop foot-wears AFOs), edema, DM, Barretts esophagus, acoustic neuroma (hears best out of R ear), and brain surgery.    Clinical Impression  Pt is generally weak and deconditioned, but motivated to go home with his wife and daughter's assist.  Wife was adamant that he will not go to SNF and has been happy with [facility] in the past.  I encouraged her to make sure someone was with him when he is up on his feet as he is not his normal strength or endurance right now.   PT to follow acutely for deficits listed below.       Follow Up Recommendations Home health PT;Supervision for mobility/OOB(and OT)    Equipment Recommendations  None recommended by PT    Recommendations for Other Services   NA    Precautions / Restrictions Precautions Precautions: Fall Precaution Comments: per family, pt has not fallen (unitl now) very often, does not have his AFOs here at the hospital.       Mobility  Bed Mobility Overal bed mobility: Needs Assistance Bed Mobility: Supine to Sit     Supine to sit: Min guard     General bed mobility comments: Min guard assist for safety, HOB elevated and pt relying heavily on the rail. Wife reports they have a hospital bed at home.   Transfers Overall transfer level: Needs assistance Equipment used: Rolling walker (2 wheeled) Transfers: Sit to/from Stand Sit to Stand: Min guard         General transfer comment: Min guard assist to help boots up to RW to stand.   Ambulation/Gait Ambulation/Gait assistance: Min assist;Min guard Ambulation Distance (Feet): 55 Feet Assistive device: Rolling walker (2 wheeled) Gait Pattern/deviations: Step-through  pattern;Shuffle;Trunk flexed Gait velocity: decreased Gait velocity interpretation: Below normal speed for age/gender General Gait Details: Verbal cues for closer proximity to RW, flexed posture is likely baseline, pt fatigued quickly with gait and had to return to his room.  Reporting feeling weak (generally) on his feet, but no reports of lighehadedness or dizziness in standing, and no significant increase in pain.          Balance Overall balance assessment: Needs assistance Sitting-balance support: Feet supported;Bilateral upper extremity supported Sitting balance-Leahy Scale: Fair     Standing balance support: Bilateral upper extremity supported Standing balance-Leahy Scale: Poor Standing balance comment: needs support from RW and therapist for safety.                              Pertinent Vitals/Pain Pain Assessment: 0-10 Pain Score: 5  Pain Location: left side Pain Descriptors / Indicators: Grimacing;Guarding Pain Intervention(s): Limited activity within patient's tolerance;Monitored during session;Repositioned    Home Living Family/patient expects to be discharged to:: Private residence Living Arrangements: Spouse/significant other Available Help at Discharge: Family;Available 24 hours/day(daughter retired and staying to help her mom and dad) Type of Home: House Home Access: [place]: One [place]: Environmental consultant - 2 wheels;Bedside commode;Tub bench;Hand held shower head;Hospital bed;Other (comment)(lift chair)      Prior Function Level of Independence: Needs assistance   Gait / Transfers Assistance Needed: walked around the home with  RW essentially unassisted.  Wife reported she has to help him off of the commode at times.               Extremity/Trunk Assessment   Upper Extremity Assessment Upper Extremity Assessment: Generalized weakness    Lower Extremity Assessment Lower Extremity Assessment: Generalized  weakness(bil LE edema that is chronic for him and bil foot drop)    Cervical / Trunk Assessment Cervical / Trunk Assessment: Kyphotic  Communication   Communication: HOH  Cognition Arousal/Alertness: Awake/alert Behavior During Therapy: WFL for tasks assessed/performed Overall Cognitive Status: History of cognitive impairments - at baseline                                        General Comments General comments (skin integrity, edema, etc.): Wife reports she is adamate about not taking him to SNF for rehab.  She would rather rehab him at home and has used a [facility] in the past that she enjoys.     Exercises     Assessment/Plan    PT Assessment Patient needs continued PT services  PT Problem List Decreased strength;Decreased activity tolerance;Decreased mobility;Decreased balance;Decreased knowledge of use of DME;Pain       PT Treatment Interventions DME instruction;Gait training;Functional mobility training;Therapeutic activities;Therapeutic exercise;Balance training;Patient/family education    PT Goals (Current goals can be found in the Care Plan section)  Acute Rehab PT Goals Patient Stated Goal: to get home today if possible.  PT Goal Formulation: With patient/family Time For Goal Achievement: 05/17/17 Potential to Achieve Goals: Good    Frequency Min 3X/week           AM-PAC PT "6 Clicks" Daily Activity  Outcome Measure Difficulty turning over in bed (including adjusting bedclothes, sheets and blankets)?: A Little Difficulty moving from lying on back to sitting on the side of the bed? : Unable Difficulty sitting down on and standing up from a chair with arms (e.g., wheelchair, bedside commode, etc,.)?: Unable Help needed moving to and from a bed to chair (including a wheelchair)?: A Little Help needed walking in hospital room?: A Little Help needed climbing 3-5 steps with a railing? : A Lot 6 Click Score: 13    End of Session   Activity  Tolerance: Patient limited by fatigue Patient left: in chair;with call bell/phone within reach;with family/visitor present Nurse Communication: Mobility status;Other (comment)(family would like him pre medicated prior to d/c ) PT Visit Diagnosis: Unsteadiness on feet (R26.81);Difficulty in walking, not elsewhere classified (R26.2);Muscle weakness (generalized) (M62.81)    Time: 0263-7858 PT Time Calculation (min) (ACUTE ONLY): 23 min   Charges:        Wells Guiles B. Madhavi Hamblen, PT, DPT [phone]   PT Evaluation $PT Eval Moderate Complexity: 1 Mod     05/03/2017, 2:07 PM

## 2017-05-03 NOTE — Progress Notes (Signed)
Notified patient placement/tri[facility] hospitalists @ [phone] that patient has arrived to the unit.

## 2017-05-03 NOTE — Care Management Note (Addendum)
Case Management Note  Patient Details  Name: Ayush Boulet MRN: [id number] Date of Birth: [birth year]  Subjective/Objective:         10 Confirmed with Mikle Bosworth at [facility] referral fax and request for [place] received and accepted.           Action/Plan:Orders received and faxed   Discharged discharge planning at bedside with patient and family . Patient from home with wife. Choice offered . They have had [facility] in past and want that agency again. They are requesting HHPT Terry.   Called [facility] [phone], given referral. Once home health orders received will fax [phone]. Requested [facility] HHPT. Expected Discharge Date:                  Expected Discharge Plan:  [place]  In-House Referral:     Discharge planning Services  CM Consult  Post Acute Care Choice:  Home Health Choice offered to:  Patient, Spouse  DME Arranged:  N/A DME Agency:  NA  HH Arranged:  RN, PT, OT HH Agency:  [facility]  Status of Service:  In process, will continue to follow  If discussed at Long Length of Stay Meetings, dates discussed:    Additional Comments:  Marilu Favre, RN 05/03/2017, 2:09 PM

## 2017-05-03 NOTE — Progress Notes (Signed)
Patient ID: Terry Harrell, male   DOB: [birth year], 84 y.o.   MRN: [id number]       Subjective: Patient feels ok currently.  Laying in bed with not much pain at this time.  He is pulling 2000 on his IS with no pain.  Patient states he heard a "freight train" type noise right before he fell, but denies syncope.  Objective: Vital signs in last 24 hours: Temp:  [97.7 F (36.5 C)-98.2 F (36.8 C)] 97.9 F (36.6 C) (03/21 0503) Pulse Rate:  [55-74] 56 (03/21 0503) Resp:  [12-20] 16 (03/21 0503) BP: (134-170)/(70-90) 134/86 (03/21 0503) SpO2:  [93 %-98 %] 93 % (03/21 0503) Weight:  [157 lb (71.2 kg)-158 lb (71.7 kg)] 157 lb (71.2 kg) (03/21 0240) Last BM Date: 05/02/17  Intake/Output from previous day: 03/20 0701 - 03/21 0700 In: -  Out: 200 [Urine:200] Intake/Output this shift: Total I/O In: -  Out: 50 [Urine:50]  PE: Gen: NAD Heart: regular Lungs/chest: CTAB, ecchymosis on lower left lateral chest wall   Lab Results:  Recent Labs    05/02/17 2057  WBC 10.4  HGB 12.5*  HCT 36.4*  PLT 198   BMET Recent Labs    05/02/17 2057  NA 139  K 4.1  CL 111  CO2 21*  GLUCOSE 146*  BUN 33*  CREATININE 1.08  CALCIUM 8.8*   PT/INR No results for input(s): LABPROT, INR in the last 72 hours. CMP     Component Value Date/Time   NA 139 05/02/2017 2057   K 4.1 05/02/2017 2057   CL 111 05/02/2017 2057   CO2 21 (L) 05/02/2017 2057   GLUCOSE 146 (H) 05/02/2017 2057   BUN 33 (H) 05/02/2017 2057   CREATININE 1.08 05/02/2017 2057   CALCIUM 8.8 (L) 05/02/2017 2057   GFRNONAA 60 (L) 05/02/2017 2057   GFRAA >60 05/02/2017 2057   Lipase  No results found for: LIPASE     Studies/Results: Dg Ribs Unilateral W/chest Left  Result Date: 05/02/2017 CLINICAL DATA:  Status post fall.  Left lower anterior rib pain EXAM: LEFT RIBS AND CHEST - 3+ VIEW COMPARISON:  None. FINDINGS: No fracture or other bone lesions are seen involving the ribs. There is no evidence of pneumothorax or  pleural effusion. Mild right basilar atelectasis. Hazy left lower lobe airspace disease which may reflect atelectasis versus pneumonia. Heart size and mediastinal contours are within normal limits. IMPRESSION: 1. No acute injury of the left ribs. 2. Mild right basilar atelectasis. Hazy left lower lobe airspace disease which may reflect atelectasis versus pneumonia. Electronically Signed   By: Kathreen Devoid   On: 05/02/2017 20:33   Ct Head Wo Contrast  Result Date: 05/02/2017 CLINICAL DATA:  Head trauma, status post fall. EXAM: CT HEAD WITHOUT CONTRAST TECHNIQUE: Contiguous axial images were obtained from the base of the skull through the vertex without intravenous contrast. COMPARISON:  None. FINDINGS: Brain: No evidence of acute infarction, hemorrhage, extra-axial collection, ventriculomegaly, or mass effect. Large area of right frontal lobe encephalomalacia. Generalized cerebral atrophy. Periventricular white matter low attenuation likely secondary to microangiopathy. Vascular: Cerebrovascular atherosclerotic calcifications are noted. Skull: Prior right frontotemporal craniotomy. No acute osseous abnormality. Sinuses/Orbits: Visualized portions of the orbits are unremarkable. Visualized portions of the paranasal sinuses and mastoid air cells are unremarkable. Other: None. IMPRESSION: No acute intracranial pathology. Electronically Signed   By: Kathreen Devoid   On: 05/02/2017 20:24   Ct Chest W Contrast  Result Date: 05/02/2017 CLINICAL DATA:  Patient fell  tonight and presents with left lower to anterior rib pain. EXAM: CT CHEST, ABDOMEN, AND PELVIS WITH CONTRAST TECHNIQUE: Multidetector CT imaging of the chest, abdomen and pelvis was performed following the standard protocol during bolus administration of intravenous contrast. CONTRAST:  80 cc Isovue-300 COMPARISON:  None. FINDINGS: CT CHEST FINDINGS Cardiovascular: Normal branch pattern of the great vessels without stenosis. Aortic atherosclerosis with  ectasia. No dissection. No evidence of mediastinal hematoma. Dilated main pulmonary artery to 3.7 cm consistent with chronic pulmonary hypertension. Heart is enlarged with trace pericardial effusion posteriorly on the left. Coronary arterial calcifications are identified left circumflex. No large central pulmonary embolus is noted though the study is not tailored for the assessment of embolus. Mediastinum/Nodes: Patent trachea and mainstem bronchi. No mediastinal nor hilar adenopathy. No thyromegaly or mass. Small amount of fluid in the distal esophagus associated small hiatal hernia. Lungs/Pleura: Bilateral dependent atelectasis. Peribronchial thickening is noted bilaterally to the lower lobes in particular. Patchy pneumonic consolidations seen in the left lower lobe. Trace fluid in the left major fissure. Musculoskeletal: Acute anterolateral left sixth through ninth rib fractures with chronic healed posterior left eleventh rib fracture is identified. Angulated fracture, remote in appearance involving the right second anterior rib. Small cartilaginous rests along the lateral aspect of the right eighth rib. CT ABDOMEN PELVIS FINDINGS Hepatobiliary: Colonic interposition between the liver and ventral abdominal wall. No enhancing mass lesion of the liver. No liver laceration. Physiologic distention of the gallbladder without stones. No biliary dilatation. Pancreas: Marked fatty atrophy of the pancreas. Small cystic lesions involving the body of the pancreas measuring 7 and 9 mm each that may represent small side branch intraductal papillary mucinous neoplasms. Spleen: Normal without laceration. Adrenals/Urinary Tract: Bilateral adrenal nodules compatible measuring up to 1.9 cm on the left and 2.4 cm on the right. Findings are indeterminate but may represent benign adenomata which can be confirmed with nonemergent CT or MRI with adrenal mass protocol. Renal atrophy with cortical thinning is noted bilaterally.  Indeterminate hypodensity off the upper pole the left kidney measuring 7 mm is identified with a similar-sized 6 mm hypodensity off the lower pole the right kidney. These may represent simple and/or complex cysts but are too small to further characterize. Bladder diverticula are noted the largest is posterolateral on the left measuring 9.5 x 5.3 x 8.5 cm. Smaller posterior bladder diverticulum containing either mural calcification or dependent calculi/milk of calcium is noted. Stomach/Bowel: Small hiatal hernia. Nondistended stomach with normal small bowel rotation. No mural hematoma or thickening. No bowel obstruction or inflammation. The appendix is surgically absent by report. Mild circumferential thickening in the anorectal region may reflect internal hemorrhoids. Vascular/Lymphatic: Mild to moderate aortoiliac atherosclerosis without aneurysm. No lymphadenopathy. Reproductive: Enlarged prostate measuring 4.8 x 6.4 x 4.7 cm with central and peripheral zone calcifications. Other: No abdominal wall hernia or abnormality. No abdominopelvic ascites. Musculoskeletal: Remote appearing osteoporotic compression fractures with mild biconcave compressions of T4, T6, mild-to-moderate inferior endplate compressions of T9 and T10 and moderate biconcave compression fractures at T12 and L1. Grade 1 anterolisthesis associated degenerative disc disease noted at L3-4. IMPRESSION: Chest CT: 1. Acute anterolateral left sixth through ninth rib fractures with age-indeterminate anterior angulated fracture of the right second rib. Remote appearing posterior left eleventh rib fracture with callus. No associated pneumothorax. 2. Patchy left lower lobe pulmonary consolidations suspicious for pneumonia. 3. Coronary arteriosclerosis along the left circumflex. 4. Mild dilatation of the main pulmonary artery compatible with chronic pulmonary hypertension. CT abdomen and pelvis: 1.  Two small cystic lesions of the pancreatic body measuring 7  and 9 mm that may represent small side branch cystic IPMN. 2. Bilateral adrenal nodules that may reflect benign adenomata. These can be further worked up non emergently with dedicated CT or MRI with adrenal mass protocol. 3. Tiny too small to further characterize simple and complex appearing cysts of both kidneys without worrisome features. 4. Multiple bladder diverticula, the largest on the left measuring 9.5 x 5.3 x 8.5 cm. Smaller posterior bladder diverticula with milk of calcium are noted. 5. Enlarged prostate. 6. Multiple osteoporotic appearing compression fractures of the thoracolumbar spine. Grade 1 anterolisthesis of L3 on L4 associated with degenerative disc disease. Electronically Signed   By: Ashley Royalty M.D.   On: 05/02/2017 22:55   Ct Abdomen Pelvis W Contrast  Result Date: 05/02/2017 CLINICAL DATA:  Patient fell tonight and presents with left lower to anterior rib pain. EXAM: CT CHEST, ABDOMEN, AND PELVIS WITH CONTRAST TECHNIQUE: Multidetector CT imaging of the chest, abdomen and pelvis was performed following the standard protocol during bolus administration of intravenous contrast. CONTRAST:  80 cc Isovue-300 COMPARISON:  None. FINDINGS: CT CHEST FINDINGS Cardiovascular: Normal branch pattern of the great vessels without stenosis. Aortic atherosclerosis with ectasia. No dissection. No evidence of mediastinal hematoma. Dilated main pulmonary artery to 3.7 cm consistent with chronic pulmonary hypertension. Heart is enlarged with trace pericardial effusion posteriorly on the left. Coronary arterial calcifications are identified left circumflex. No large central pulmonary embolus is noted though the study is not tailored for the assessment of embolus. Mediastinum/Nodes: Patent trachea and mainstem bronchi. No mediastinal nor hilar adenopathy. No thyromegaly or mass. Small amount of fluid in the distal esophagus associated small hiatal hernia. Lungs/Pleura: Bilateral dependent atelectasis.  Peribronchial thickening is noted bilaterally to the lower lobes in particular. Patchy pneumonic consolidations seen in the left lower lobe. Trace fluid in the left major fissure. Musculoskeletal: Acute anterolateral left sixth through ninth rib fractures with chronic healed posterior left eleventh rib fracture is identified. Angulated fracture, remote in appearance involving the right second anterior rib. Small cartilaginous rests along the lateral aspect of the right eighth rib. CT ABDOMEN PELVIS FINDINGS Hepatobiliary: Colonic interposition between the liver and ventral abdominal wall. No enhancing mass lesion of the liver. No liver laceration. Physiologic distention of the gallbladder without stones. No biliary dilatation. Pancreas: Marked fatty atrophy of the pancreas. Small cystic lesions involving the body of the pancreas measuring 7 and 9 mm each that may represent small side branch intraductal papillary mucinous neoplasms. Spleen: Normal without laceration. Adrenals/Urinary Tract: Bilateral adrenal nodules compatible measuring up to 1.9 cm on the left and 2.4 cm on the right. Findings are indeterminate but may represent benign adenomata which can be confirmed with nonemergent CT or MRI with adrenal mass protocol. Renal atrophy with cortical thinning is noted bilaterally. Indeterminate hypodensity off the upper pole the left kidney measuring 7 mm is identified with a similar-sized 6 mm hypodensity off the lower pole the right kidney. These may represent simple and/or complex cysts but are too small to further characterize. Bladder diverticula are noted the largest is posterolateral on the left measuring 9.5 x 5.3 x 8.5 cm. Smaller posterior bladder diverticulum containing either mural calcification or dependent calculi/milk of calcium is noted. Stomach/Bowel: Small hiatal hernia. Nondistended stomach with normal small bowel rotation. No mural hematoma or thickening. No bowel obstruction or inflammation. The  appendix is surgically absent by report. Mild circumferential thickening in the anorectal region may reflect  internal hemorrhoids. Vascular/Lymphatic: Mild to moderate aortoiliac atherosclerosis without aneurysm. No lymphadenopathy. Reproductive: Enlarged prostate measuring 4.8 x 6.4 x 4.7 cm with central and peripheral zone calcifications. Other: No abdominal wall hernia or abnormality. No abdominopelvic ascites. Musculoskeletal: Remote appearing osteoporotic compression fractures with mild biconcave compressions of T4, T6, mild-to-moderate inferior endplate compressions of T9 and T10 and moderate biconcave compression fractures at T12 and L1. Grade 1 anterolisthesis associated degenerative disc disease noted at L3-4. IMPRESSION: Chest CT: 1. Acute anterolateral left sixth through ninth rib fractures with age-indeterminate anterior angulated fracture of the right second rib. Remote appearing posterior left eleventh rib fracture with callus. No associated pneumothorax. 2. Patchy left lower lobe pulmonary consolidations suspicious for pneumonia. 3. Coronary arteriosclerosis along the left circumflex. 4. Mild dilatation of the main pulmonary artery compatible with chronic pulmonary hypertension. CT abdomen and pelvis: 1. Two small cystic lesions of the pancreatic body measuring 7 and 9 mm that may represent small side branch cystic IPMN. 2. Bilateral adrenal nodules that may reflect benign adenomata. These can be further worked up non emergently with dedicated CT or MRI with adrenal mass protocol. 3. Tiny too small to further characterize simple and complex appearing cysts of both kidneys without worrisome features. 4. Multiple bladder diverticula, the largest on the left measuring 9.5 x 5.3 x 8.5 cm. Smaller posterior bladder diverticula with milk of calcium are noted. 5. Enlarged prostate. 6. Multiple osteoporotic appearing compression fractures of the thoracolumbar spine. Grade 1 anterolisthesis of L3 on L4  associated with degenerative disc disease. Electronically Signed   By: Ashley Royalty M.D.   On: 05/02/2017 22:55    Anti-infectives: Anti-infectives (From admission, onward)   None       Assessment/Plan  Ground level fall  Left 6-9 rib fx - pulmonary toileting, IS, already pulling 2000. DM - per medicine  FEN - Carb mod diet VTE - Heparin 5000 TID ID - none  Dispo - PT ordered for mobilization.  Pain control per medicine. Will follow.  Follow up CXR pending, but appears stable.   LOS: 0 days    Henreitta Cea , [facility] 05/03/2017, 7:39 AM Pager: [phone] Consults: [phone] Mon-Fri 7:00 am-4:30 pm Sat-Sun 7:00 am-11:30 am

## 2017-11-29 ENCOUNTER — Inpatient Hospital Stay (HOSPITAL_BASED_OUTPATIENT_CLINIC_OR_DEPARTMENT_OTHER)
Admission: EM | Admit: 2017-11-29 | Discharge: 2017-12-08 | DRG: 853 | Disposition: A | Discharge status: Skilled Nursing Facility | Payer: Medicare Other | Attending: Internal Medicine | Admitting: Internal Medicine

## 2017-11-29 ENCOUNTER — Emergency Department (HOSPITAL_BASED_OUTPATIENT_CLINIC_OR_DEPARTMENT_OTHER): Payer: Medicare Other

## 2017-11-29 ENCOUNTER — Other Ambulatory Visit: Payer: Self-pay

## 2017-11-29 ENCOUNTER — Encounter (HOSPITAL_BASED_OUTPATIENT_CLINIC_OR_DEPARTMENT_OTHER): Payer: Self-pay

## 2017-11-29 DIAGNOSIS — F0390 Unspecified dementia without behavioral disturbance: Secondary | ICD-10-CM | POA: Diagnosis present

## 2017-11-29 DIAGNOSIS — N179 Acute kidney failure, unspecified: Secondary | ICD-10-CM | POA: Diagnosis present

## 2017-11-29 DIAGNOSIS — Z87891 Personal history of nicotine dependence: Secondary | ICD-10-CM | POA: Diagnosis not present

## 2017-11-29 DIAGNOSIS — Z79899 Other long term (current) drug therapy: Secondary | ICD-10-CM

## 2017-11-29 DIAGNOSIS — S72012A Unspecified intracapsular fracture of left femur, initial encounter for closed fracture: Secondary | ICD-10-CM | POA: Diagnosis present

## 2017-11-29 DIAGNOSIS — J189 Pneumonia, unspecified organism: Secondary | ICD-10-CM | POA: Diagnosis present

## 2017-11-29 DIAGNOSIS — R05 Cough: Secondary | ICD-10-CM

## 2017-11-29 DIAGNOSIS — Z7984 Long term (current) use of oral hypoglycemic drugs: Secondary | ICD-10-CM

## 2017-11-29 DIAGNOSIS — D333 Benign neoplasm of cranial nerves: Secondary | ICD-10-CM | POA: Diagnosis present

## 2017-11-29 DIAGNOSIS — Z419 Encounter for procedure for purposes other than remedying health state, unspecified: Secondary | ICD-10-CM

## 2017-11-29 DIAGNOSIS — R509 Fever, unspecified: Secondary | ICD-10-CM

## 2017-11-29 DIAGNOSIS — H9190 Unspecified hearing loss, unspecified ear: Secondary | ICD-10-CM | POA: Diagnosis present

## 2017-11-29 DIAGNOSIS — Z888 Allergy status to other drugs, medicaments and biological substances status: Secondary | ICD-10-CM

## 2017-11-29 DIAGNOSIS — Z9181 History of falling: Secondary | ICD-10-CM

## 2017-11-29 DIAGNOSIS — K219 Gastro-esophageal reflux disease without esophagitis: Secondary | ICD-10-CM | POA: Diagnosis present

## 2017-11-29 DIAGNOSIS — B379 Candidiasis, unspecified: Secondary | ICD-10-CM | POA: Diagnosis not present

## 2017-11-29 DIAGNOSIS — F329 Major depressive disorder, single episode, unspecified: Secondary | ICD-10-CM | POA: Diagnosis present

## 2017-11-29 DIAGNOSIS — N400 Enlarged prostate without lower urinary tract symptoms: Secondary | ICD-10-CM | POA: Diagnosis present

## 2017-11-29 DIAGNOSIS — D509 Iron deficiency anemia, unspecified: Secondary | ICD-10-CM | POA: Diagnosis present

## 2017-11-29 DIAGNOSIS — D539 Nutritional anemia, unspecified: Secondary | ICD-10-CM | POA: Diagnosis present

## 2017-11-29 DIAGNOSIS — Z8249 Family history of ischemic heart disease and other diseases of the circulatory system: Secondary | ICD-10-CM

## 2017-11-29 DIAGNOSIS — K2270 Barrett's esophagus without dysplasia: Secondary | ICD-10-CM | POA: Diagnosis present

## 2017-11-29 DIAGNOSIS — A411 Sepsis due to other specified staphylococcus: Principal | ICD-10-CM | POA: Diagnosis present

## 2017-11-29 DIAGNOSIS — N401 Enlarged prostate with lower urinary tract symptoms: Secondary | ICD-10-CM | POA: Diagnosis not present

## 2017-11-29 DIAGNOSIS — I340 Nonrheumatic mitral (valve) insufficiency: Secondary | ICD-10-CM | POA: Diagnosis present

## 2017-11-29 DIAGNOSIS — S72002A Fracture of unspecified part of neck of left femur, initial encounter for closed fracture: Secondary | ICD-10-CM | POA: Diagnosis not present

## 2017-11-29 DIAGNOSIS — E119 Type 2 diabetes mellitus without complications: Secondary | ICD-10-CM | POA: Diagnosis not present

## 2017-11-29 DIAGNOSIS — E872 Acidosis: Secondary | ICD-10-CM | POA: Diagnosis not present

## 2017-11-29 DIAGNOSIS — Z23 Encounter for immunization: Secondary | ICD-10-CM

## 2017-11-29 DIAGNOSIS — R059 Cough, unspecified: Secondary | ICD-10-CM

## 2017-11-29 DIAGNOSIS — Z960 Presence of urogenital implants: Secondary | ICD-10-CM | POA: Diagnosis not present

## 2017-11-29 DIAGNOSIS — I5030 Unspecified diastolic (congestive) heart failure: Secondary | ICD-10-CM | POA: Diagnosis not present

## 2017-11-29 DIAGNOSIS — R319 Hematuria, unspecified: Secondary | ICD-10-CM | POA: Diagnosis present

## 2017-11-29 DIAGNOSIS — R350 Frequency of micturition: Secondary | ICD-10-CM | POA: Diagnosis not present

## 2017-11-29 DIAGNOSIS — Z886 Allergy status to analgesic agent status: Secondary | ICD-10-CM

## 2017-11-29 DIAGNOSIS — J9601 Acute respiratory failure with hypoxia: Secondary | ICD-10-CM | POA: Diagnosis present

## 2017-11-29 DIAGNOSIS — W1830XA Fall on same level, unspecified, initial encounter: Secondary | ICD-10-CM | POA: Diagnosis present

## 2017-11-29 DIAGNOSIS — E1140 Type 2 diabetes mellitus with diabetic neuropathy, unspecified: Secondary | ICD-10-CM | POA: Diagnosis present

## 2017-11-29 DIAGNOSIS — Z01818 Encounter for other preprocedural examination: Secondary | ICD-10-CM

## 2017-11-29 DIAGNOSIS — B957 Other staphylococcus as the cause of diseases classified elsewhere: Secondary | ICD-10-CM | POA: Diagnosis not present

## 2017-11-29 DIAGNOSIS — G500 Trigeminal neuralgia: Secondary | ICD-10-CM | POA: Diagnosis present

## 2017-11-29 DIAGNOSIS — D72829 Elevated white blood cell count, unspecified: Secondary | ICD-10-CM

## 2017-11-29 DIAGNOSIS — S51012A Laceration without foreign body of left elbow, initial encounter: Secondary | ICD-10-CM | POA: Diagnosis present

## 2017-11-29 DIAGNOSIS — R7881 Bacteremia: Secondary | ICD-10-CM

## 2017-11-29 DIAGNOSIS — Z885 Allergy status to narcotic agent status: Secondary | ICD-10-CM | POA: Diagnosis not present

## 2017-11-29 DIAGNOSIS — M25552 Pain in left hip: Secondary | ICD-10-CM | POA: Diagnosis present

## 2017-11-29 DIAGNOSIS — Z87440 Personal history of urinary (tract) infections: Secondary | ICD-10-CM | POA: Diagnosis not present

## 2017-11-29 DIAGNOSIS — W19XXXA Unspecified fall, initial encounter: Secondary | ICD-10-CM | POA: Diagnosis not present

## 2017-11-29 DIAGNOSIS — F515 Nightmare disorder: Secondary | ICD-10-CM | POA: Diagnosis not present

## 2017-11-29 DIAGNOSIS — A419 Sepsis, unspecified organism: Secondary | ICD-10-CM | POA: Diagnosis not present

## 2017-11-29 DIAGNOSIS — S7292XA Unspecified fracture of left femur, initial encounter for closed fracture: Secondary | ICD-10-CM

## 2017-11-29 DIAGNOSIS — K225 Diverticulum of esophagus, acquired: Secondary | ICD-10-CM | POA: Diagnosis not present

## 2017-11-29 DIAGNOSIS — B370 Candidal stomatitis: Secondary | ICD-10-CM | POA: Diagnosis not present

## 2017-11-29 LAB — CBC WITH DIFFERENTIAL/PLATELET
Abs Immature Granulocytes: 0.09 10*3/uL — ABNORMAL HIGH (ref 0.00–0.07)
BASOS PCT: 1 %
Basophils Absolute: 0.1 10*3/uL (ref 0.0–0.1)
EOS PCT: 1 %
Eosinophils Absolute: 0.1 10*3/uL (ref 0.0–0.5)
HCT: 36.6 % — ABNORMAL LOW (ref 39.0–52.0)
Hemoglobin: 12.2 g/dL — ABNORMAL LOW (ref 13.0–17.0)
Immature Granulocytes: 1 %
Lymphocytes Relative: 10 %
Lymphs Abs: 1.1 10*3/uL (ref 0.7–4.0)
MCH: 37.4 pg — AB (ref 26.0–34.0)
MCHC: 33.3 g/dL (ref 30.0–36.0)
MCV: 112.3 fL — AB (ref 80.0–100.0)
MONO ABS: 0.7 10*3/uL (ref 0.1–1.0)
Monocytes Relative: 6 %
Neutro Abs: 9.2 10*3/uL — ABNORMAL HIGH (ref 1.7–7.7)
Neutrophils Relative %: 81 %
PLATELETS: 209 10*3/uL (ref 150–400)
RBC: 3.26 MIL/uL — AB (ref 4.22–5.81)
RDW: 13.9 % (ref 11.5–15.5)
WBC: 11.3 10*3/uL — AB (ref 4.0–10.5)
nRBC: 0 % (ref 0.0–0.2)

## 2017-11-29 LAB — BASIC METABOLIC PANEL
Anion gap: 9 (ref 5–15)
BUN: 25 mg/dL — AB (ref 8–23)
CO2: 20 mmol/L — ABNORMAL LOW (ref 22–32)
CREATININE: 0.81 mg/dL (ref 0.61–1.24)
Calcium: 8.9 mg/dL (ref 8.9–10.3)
Chloride: 107 mmol/L (ref 98–111)
GFR calc non Af Amer: >60 mL/min (ref >60)
Glucose, Bld: 118 mg/dL — ABNORMAL HIGH (ref 70–99)
POTASSIUM: 4.3 mmol/L (ref 3.5–5.1)
SODIUM: 136 mmol/L (ref 135–145)

## 2017-11-29 LAB — PROTIME-INR
INR: 1.07
PROTHROMBIN TIME: 13.8 s (ref 11.4–15.2)

## 2017-11-29 MED ORDER — TETANUS-DIPHTH-ACELL PERTUSSIS 5-2.5-18.5 LF-MCG/0.5 IM SUSP
0.5000 mL | Freq: Once | INTRAMUSCULAR | Status: AC
  Administered 2017-11-29: 0.5 mL via INTRAMUSCULAR
  Filled 2017-11-29: qty 0.5

## 2017-11-29 NOTE — ED Notes (Signed)
[facility] notified (Terry Harrell) - patient ready for transport 

## 2017-11-29 NOTE — ED Provider Notes (Signed)
[facility] Provider Note   CSN: [id number] Arrival date & time: 11/29/17  1520     History   Chief Complaint Chief Complaint  Patient presents with  . Fall    HPI Terry Harrell is a 84 y.o. male.  Patient is a 84 year old male who presents with hip pain after a fall.  He lives at home with his wife.  His wife states that he is supposed to not get out of bed unassisted.  He is only able to ambulate with a walker.  She was in the bathroom and he attempted to get up on his own.  He fell on his side.  He has some skin tears to his left elbow and left wrist.  He and his wife both say that he did not hit his head.  There is no loss of consciousness.  He denies any neck or back pain.  He was ambulating okay but about an hour after the fall he started complaining of pain in his left hip and has been unable to ambulate due to that pain since that time.  He denies any other injuries from the fall.  The wife does not know when his last tetanus shot was.  He is not on anticoagulants.  He is otherwise had no change in his mental status.  No recent illnesses.  No complaints of chest pain or shortness of breath.  She states that he is prone to falling if he gets up unassisted and this is not unusual for him.  He is at his baseline mental status.     Past Medical History:  Diagnosis Date  . Acoustic neuroma ([place])   . Barretts esophagus   . Depression   . Diabetes mellitus without complication ([place])   . Edema   . GERD (gastroesophageal reflux disease)   . Kidney infection   . Neuropathy   . Spinal stenosis   . Trigeminal neuralgia     Patient Active Problem List   Diagnosis Date Noted  . Closed left hip fracture, initial encounter ([place]) 11/29/2017  . Pressure injury of skin 05/03/2017  . Fall 05/02/2017  . Left rib fracture 05/02/2017  . BPH (benign prostatic hyperplasia) 05/02/2017  . GERD (gastroesophageal reflux disease)   . Diabetes mellitus without  complication ([place])   . Trigeminal neuralgia     Past Surgical History:  Procedure Laterality Date  . APPENDECTOMY    . BRAIN SURGERY    . HERNIA REPAIR          Home Medications    Prior to Admission medications   Medication Sig Start Date End Date Taking? Authorizing Provider  acetaminophen (TYLENOL) 325 MG tablet Take 2 tablets (650 mg total) by mouth every 6 (six) hours as needed for mild pain (or Fever >/= 101). 05/03/17   Vann, Jessica U, DO  baci-polymyx-neo-hydrocort (CORTISPORIN) 1 % ointment Place into the left eye 2 (two) times daily.    [provider]  carbamazepine (TEGRETOL) 100 MG chewable tablet Chew 100 mg by mouth daily.    [provider]  docusate sodium (COLACE) 100 MG capsule Take 100 mg by mouth daily.    [provider]  DULoxetine (CYMBALTA) 30 MG capsule Take 30 mg by mouth daily.    [provider]  HYDROcodone-acetaminophen (NORCO/VICODIN) 5-325 MG tablet Take 1 tablet by mouth every 6 (six) hours as needed for severe pain. 05/03/17   Geradine Girt, DO  metFORMIN (GLUCOPHAGE) 500 MG tablet  Take 500 mg by mouth 2 (two) times daily with a meal.    [provider]  neomycin-polymyxin b-dexamethasone (MAXITROL) 3.5-10000-0.1 OINT Place 1 application into the left eye.    [provider]  omeprazole (PRILOSEC) 20 MG capsule Take 20 mg by mouth daily.    [provider]  psyllium (REGULOID) 0.52 g capsule Take 0.52 g by mouth daily.    [provider]  tamsulosin (FLOMAX) 0.4 MG CAPS capsule Take 0.4 mg by mouth daily.     [provider]    Family History Family History  Problem Relation Age of Onset  . Hypertension Other     Social History Social History   Tobacco Use  . Smoking status: Former Research scientist (life sciences)  . Smokeless tobacco: Never Used  Substance Use Topics  . Alcohol use: No    Frequency: Never  . Drug use: No     Allergies   Aspirin and Fentanyl   Review of  Systems Review of Systems  Constitutional: Negative for chills, diaphoresis, fatigue and fever.  HENT: Negative for congestion, rhinorrhea and sneezing.   Eyes: Negative.   Respiratory: Negative for cough, chest tightness and shortness of breath.   Cardiovascular: Negative for chest pain and leg swelling.  Gastrointestinal: Negative for abdominal pain, blood in stool, diarrhea, nausea and vomiting.  Genitourinary: Negative for difficulty urinating, flank pain, frequency and hematuria.  Musculoskeletal: Positive for arthralgias. Negative for back pain.  Skin: Positive for wound. Negative for rash.  Neurological: Negative for dizziness, speech difficulty, weakness, numbness and headaches.     Physical Exam Updated Vital Signs BP 136/83 (BP Location: Right Arm)   Pulse 83   Temp 98.2 F (36.8 C) (Oral)   Resp (!) 21   Ht 5\' 10"  (1.778 m)   Wt 71.2 kg   SpO2 91%   BMI 22.53 kg/m   Physical Exam  Constitutional: He is oriented to person, place, and time. He appears well-developed and well-nourished.  HENT:  Head: Normocephalic and atraumatic.  No visible signs of head trauma  Eyes: Pupils are equal, round, and reactive to light.  Neck: Normal range of motion. Neck supple.  No pain along the cervical thoracic or lumbosacral spine.  Cardiovascular: Normal rate, regular rhythm and normal heart sounds.  Pulmonary/Chest: Effort normal and breath sounds normal. No respiratory distress. He has no wheezes. He has no rales. He exhibits no tenderness.  Abdominal: Soft. Bowel sounds are normal. There is no tenderness. There is no rebound and no guarding.  Musculoskeletal: He exhibits tenderness. He exhibits no edema.  Patient has pain on range of motion of his left hip.  There is no pain to the knee or ankle.  He has some small skin tears to his left elbow and left wrist without underlying bony tenderness.  There is no other pain on palpation or range of motion of the extremities.  Bilateral  leg swelling (chronic and unchanged per wife).  Feet cool to touch (also chronic per wife).  DP pulse present by doppler  Lymphadenopathy:    He has no cervical adenopathy.  Neurological: He is alert and oriented to person, place, and time.  Skin: Skin is warm and dry. No rash noted.  Psychiatric: He has a normal mood and affect.     ED Treatments / Results  Labs (all labs ordered are listed, but only abnormal results are displayed) Labs Reviewed  BASIC METABOLIC PANEL - Abnormal; Notable for the following components:  Result Value   CO2 20 (*)    Glucose, Bld 118 (*)    BUN 25 (*)    All other components within normal limits  CBC WITH DIFFERENTIAL/PLATELET - Abnormal; Notable for the following components:   WBC 11.3 (*)    RBC 3.26 (*)    Hemoglobin 12.2 (*)    HCT 36.6 (*)    MCV 112.3 (*)    MCH 37.4 (*)    Neutro Abs 9.2 (*)    Abs Immature Granulocytes 0.09 (*)    All other components within normal limits  PROTIME-INR    EKG EKG Interpretation  Date/Time:  Thursday November 29 2017 19:29:41 EDT Ventricular Rate:  68 PR Interval:    QRS Duration: 87 QT Interval:  385 QTC Calculation: 410 R Axis:   83 Text Interpretation:  Unknown rhythm, irregular rate Short PR interval Borderline right axis deviation Confirmed by Malvin Johns [phone]) on 11/29/2017 7:34:05 PM   Radiology Dg Chest 2 View  Result Date: 11/29/2017 CLINICAL DATA:  84 year old male status post fall with left hip fracture and hypoxia. Former smoker. EXAM: CHEST - 2 VIEW COMPARISON:  05/03/2017 FINDINGS: Cardiomegaly with tortuous atherosclerotic aorta. Pulmonary consolidation at the left base suspicious for pneumonia and/or atelectasis. Mild interstitial edema is noted. Subtle cortical discontinuity of the right mid and lower scapular cortex may represent subtle fractures. Correlate for pain and if needed dedicated scapular views are suggested. IMPRESSION: Cardiomegaly with pulmonary consolidation  at the left lung base suspicious for atelectasis and/or pneumonia. Mild vascular congestion. Slight cortical irregularity of the mid and lower right scapula query fracture. Electronically Signed   By: Ashley Royalty M.D.   On: 11/29/2017 19:58   Ct Hip Left Wo Contrast  Result Date: 11/29/2017 CLINICAL DATA:  Patient fell onto left side while getting out of bed this morning and presents complaining of left hip pain. EXAM: CT OF THE LEFT HIP WITHOUT CONTRAST TECHNIQUE: Multidetector CT imaging of the left hip was performed according to the standard protocol. Multiplanar CT image reconstructions were also generated. COMPARISON:  Plain radiographs from earlier today. FINDINGS: Bones/Joint/Cartilage Acute, closed, slightly impacted subcapital left femoral neck fracture with slight impaction along the superolateral aspect of the femoral head-neck junction. No joint dislocation or effusion. The adjacent acetabulum and included pubic rami appear intact. There is mild chondral thinning consistent with osteoarthritis. Ligaments Suboptimally assessed by CT. Muscles and Tendons Negative Soft tissues Soft tissue contusion along the lateral aspect of the left hip edema. No focal fluid collection to suggest hematoma. IMPRESSION: Acute subcapital left femoral neck fracture with slight impaction along the superolateral aspect of the femoral head-neck junction. Electronically Signed   By: Ashley Royalty M.D.   On: 11/29/2017 17:35   Dg Knee Complete 4 Views Left  Result Date: 11/29/2017 CLINICAL DATA:  Left knee pain after fall EXAM: LEFT KNEE - COMPLETE 4+ VIEW COMPARISON:  None. FINDINGS: Mild-to-moderate femorotibial joint space narrowing. Trace joint effusion. No acute fracture or malalignment of the left knee. IMPRESSION: Degenerative joint space narrowing of the femorotibial compartment. No acute osseous abnormality. Electronically Signed   By: Ashley Royalty M.D.   On: 11/29/2017 17:40   Dg Hip Unilat With Pelvis 2-3 Views  Left  Result Date: 11/29/2017 CLINICAL DATA:  [place] with pain in the left hip. Limited range of motion. EXAM: DG HIP (WITH OR WITHOUT PELVIS) 2-3V LEFT COMPARISON:  None. FINDINGS: There is no evidence of hip fracture or dislocation. There is no evidence of  arthropathy or other focal bone abnormality. IMPRESSION: Negative. Electronically Signed   By: Nelson Chimes M.D.   On: 11/29/2017 16:18    Procedures Procedures (including critical care time)  Medications Ordered in ED Medications  Tdap (BOOSTRIX) injection 0.5 mL (0.5 mLs Intramuscular Given 11/29/17 1638)     Initial Impression / Assessment and Plan / ED Course  I have reviewed the triage vital signs and the nursing notes.  Pertinent labs & imaging results that were available during my care of the patient were reviewed by me and considered in my medical decision making (see chart for details).     Patient is a 84 year old male who presents after a fall.  It appears to be mechanical fall.  He has pain to his left hip he has no other complaints of pain.  He had a normal x-ray of the hip and pelvis.  I did add on x-ray of the knee which was negative.  CT scan however does show an occult left subcapital hip fracture.  While he was lying in the ED, he did have some mild hypoxia with his oxygen saturations dropping to around 89%.  I reexamined his chest wall and he does not have any rib tenderness.  However I did get a chest x-ray which shows possible atelectasis versus infiltrate.  He does not have any clinical symptoms that would be more suggestive of pneumonia.  If he takes some deep breaths his oxygen saturations go back to normal.  However when he sleeping a drop back down again.  He was maintained on nasal cannula at 2 L.  There is a question of a scapula injury on the x-ray.  He does not have any significant tenderness in this area.    I spoke with Dr. Veverly Fells with orthopedics who will see the patient.  He request patient be admitted to  [facility].  He actually said that with the type of hip fracture, he may try to have the patient be treated nonoperatively.  He did advise that the patient can eat tonight but to keep him n.p.o. after midnight.  I discussed this with Dr. Hal Hope who will admit the patient to [facility].  Final Clinical Impressions(s) / ED Diagnoses   Final diagnoses:  Fall, initial encounter  Closed left hip fracture, initial encounter [facility])    ED Discharge Orders    None       Malvin Johns, MD 11/29/17 2049

## 2017-11-29 NOTE — ED Notes (Signed)
Condom cath applied and attached to urinary drainage bag to gravity.

## 2017-11-29 NOTE — ED Triage Notes (Signed)
Pt got out of bed by himself today and fell onto his L side from a standing position at 09:30 this AM. EMS report pt had small skin tears on LUE that was bandaged by wife. After sitting on morning pt now c/o pain to L hip.

## 2017-11-29 NOTE — ED Notes (Signed)
Pt. Falling asleep constantly and easily awakened.

## 2017-11-29 NOTE — ED Notes (Addendum)
Pedal pulses have been dopplered  by EDP and positive

## 2017-11-29 NOTE — ED Notes (Signed)
Pt. Has abrasion noted to the L elbow and the L knee from the fall.

## 2017-11-30 ENCOUNTER — Encounter (HOSPITAL_COMMUNITY): Payer: Self-pay | Admitting: Anesthesiology

## 2017-11-30 ENCOUNTER — Encounter (HOSPITAL_COMMUNITY)
Admission: EM | Disposition: A | Discharge status: Skilled Nursing Facility | Payer: Self-pay | Source: Home / Self Care | Attending: Internal Medicine

## 2017-11-30 DIAGNOSIS — R350 Frequency of micturition: Secondary | ICD-10-CM

## 2017-11-30 DIAGNOSIS — Z886 Allergy status to analgesic agent status: Secondary | ICD-10-CM

## 2017-11-30 DIAGNOSIS — F0390 Unspecified dementia without behavioral disturbance: Secondary | ICD-10-CM

## 2017-11-30 DIAGNOSIS — F515 Nightmare disorder: Secondary | ICD-10-CM

## 2017-11-30 DIAGNOSIS — R509 Fever, unspecified: Secondary | ICD-10-CM

## 2017-11-30 DIAGNOSIS — Z87440 Personal history of urinary (tract) infections: Secondary | ICD-10-CM

## 2017-11-30 DIAGNOSIS — Z960 Presence of urogenital implants: Secondary | ICD-10-CM

## 2017-11-30 DIAGNOSIS — Z87891 Personal history of nicotine dependence: Secondary | ICD-10-CM

## 2017-11-30 DIAGNOSIS — W19XXXA Unspecified fall, initial encounter: Secondary | ICD-10-CM

## 2017-11-30 DIAGNOSIS — N401 Enlarged prostate with lower urinary tract symptoms: Secondary | ICD-10-CM

## 2017-11-30 DIAGNOSIS — E119 Type 2 diabetes mellitus without complications: Secondary | ICD-10-CM

## 2017-11-30 DIAGNOSIS — N400 Enlarged prostate without lower urinary tract symptoms: Secondary | ICD-10-CM

## 2017-11-30 DIAGNOSIS — S72002A Fracture of unspecified part of neck of left femur, initial encounter for closed fracture: Secondary | ICD-10-CM

## 2017-11-30 DIAGNOSIS — Z885 Allergy status to narcotic agent status: Secondary | ICD-10-CM

## 2017-11-30 DIAGNOSIS — K219 Gastro-esophageal reflux disease without esophagitis: Secondary | ICD-10-CM

## 2017-11-30 LAB — URINALYSIS, ROUTINE W REFLEX MICROSCOPIC
Bilirubin Urine: NEGATIVE
GLUCOSE, UA: NEGATIVE mg/dL
KETONES UR: NEGATIVE mg/dL
Nitrite: NEGATIVE
PH: 7 (ref 5.0–8.0)
Protein, ur: 30 mg/dL — AB
RBC / HPF: >50 RBC/hpf — ABNORMAL HIGH (ref 0–5)
Specific Gravity, Urine: 1.013 (ref 1.005–1.030)

## 2017-11-30 LAB — GLUCOSE, CAPILLARY
GLUCOSE-CAPILLARY: 205 mg/dL — AB (ref 70–99)
GLUCOSE-CAPILLARY: 168 mg/dL — AB (ref 70–99)
Glucose-Capillary: 129 mg/dL — ABNORMAL HIGH (ref 70–99)

## 2017-11-30 LAB — ABO/RH: ABO/RH(D): A POS

## 2017-11-30 LAB — TYPE AND SCREEN
ABO/RH(D): A POS
Antibody Screen: NEGATIVE

## 2017-11-30 LAB — APTT: aPTT: 29 seconds (ref 24–36)

## 2017-11-30 LAB — MRSA PCR SCREENING: MRSA BY PCR: NEGATIVE

## 2017-11-30 LAB — LACTIC ACID, PLASMA
Lactic Acid, Venous: 2.1 mmol/L (ref 0.5–1.9)
Lactic Acid, Venous: 3.9 mmol/L (ref 0.5–1.9)

## 2017-11-30 SURGERY — FIXATION, FEMUR, NECK, PERCUTANEOUS, USING SCREW
Anesthesia: General | Laterality: Left

## 2017-11-30 MED ORDER — SODIUM CHLORIDE 0.9 % IV SOLN
500.0000 mg | INTRAVENOUS | Status: DC
  Administered 2017-11-30 – 2017-12-02 (×3): 500 mg via INTRAVENOUS
  Filled 2017-11-30 (×4): qty 500

## 2017-11-30 MED ORDER — ACETAMINOPHEN 325 MG PO TABS
650.0000 mg | ORAL_TABLET | Freq: Four times a day (QID) | ORAL | Status: DC | PRN
  Administered 2017-11-30 – 2017-12-07 (×4): 650 mg via ORAL
  Filled 2017-11-30 (×5): qty 2

## 2017-11-30 MED ORDER — GABAPENTIN 300 MG PO CAPS
300.0000 mg | ORAL_CAPSULE | Freq: Every day | ORAL | Status: DC
  Administered 2017-11-30 – 2017-12-07 (×8): 300 mg via ORAL
  Filled 2017-11-30 (×8): qty 1

## 2017-11-30 MED ORDER — SODIUM CHLORIDE 0.9 % IV BOLUS
500.0000 mL | Freq: Once | INTRAVENOUS | Status: AC
  Administered 2017-11-30: 500 mL via INTRAVENOUS

## 2017-11-30 MED ORDER — MORPHINE SULFATE (PF) 2 MG/ML IV SOLN: 2.0000 mg | INTRAVENOUS | Status: DC | PRN

## 2017-11-30 MED ORDER — CARBAMAZEPINE 100 MG PO CHEW
100.0000 mg | CHEWABLE_TABLET | Freq: Every day | ORAL | Status: DC
  Administered 2017-11-30 – 2017-12-04 (×5): 100 mg via ORAL
  Filled 2017-11-30 (×5): qty 1

## 2017-11-30 MED ORDER — SODIUM CHLORIDE 0.9 % IV SOLN
1.0000 g | INTRAVENOUS | Status: DC
  Administered 2017-11-30 – 2017-12-02 (×3): 1 g via INTRAVENOUS
  Filled 2017-11-30 (×4): qty 10

## 2017-11-30 MED ORDER — PANTOPRAZOLE SODIUM 40 MG PO TBEC
40.0000 mg | DELAYED_RELEASE_TABLET | Freq: Every day | ORAL | Status: DC
  Administered 2017-12-01 – 2017-12-07 (×6): 40 mg via ORAL
  Filled 2017-11-30 (×7): qty 1

## 2017-11-30 MED ORDER — ZOLPIDEM TARTRATE 5 MG PO TABS
5.0000 mg | ORAL_TABLET | Freq: Every evening | ORAL | Status: DC | PRN
  Administered 2017-11-30: 5 mg via ORAL
  Filled 2017-11-30: qty 1

## 2017-11-30 MED ORDER — OXYCODONE-ACETAMINOPHEN 5-325 MG PO TABS
1.0000 | ORAL_TABLET | ORAL | Status: DC | PRN
  Administered 2017-11-30 – 2017-12-07 (×5): 1 via ORAL
  Filled 2017-11-30 (×5): qty 1

## 2017-11-30 MED ORDER — CEFAZOLIN SODIUM-DEXTROSE 2-4 GM/100ML-% IV SOLN: 2.0000 g | INTRAVENOUS | Status: DC

## 2017-11-30 MED ORDER — HYDROCODONE-ACETAMINOPHEN 5-325 MG PO TABS
1.0000 | ORAL_TABLET | ORAL | Status: DC | PRN
  Administered 2017-12-01 – 2017-12-04 (×3): 1 via ORAL
  Administered 2017-12-07: 2 via ORAL
  Filled 2017-11-30 (×5): qty 1
  Filled 2017-11-30: qty 2

## 2017-11-30 MED ORDER — SENNA 8.6 MG PO TABS
1.0000 | ORAL_TABLET | Freq: Two times a day (BID) | ORAL | Status: DC
  Administered 2017-11-30 – 2017-12-07 (×14): 8.6 mg via ORAL
  Filled 2017-11-30 (×15): qty 1

## 2017-11-30 MED ORDER — CHLORHEXIDINE GLUCONATE 4 % EX LIQD: 60.0000 mL | Freq: Once | CUTANEOUS | Status: DC

## 2017-11-30 MED ORDER — ALBUTEROL SULFATE (2.5 MG/3ML) 0.083% IN NEBU: 2.5000 mg | INHALATION_SOLUTION | RESPIRATORY_TRACT | Status: DC | PRN

## 2017-11-30 MED ORDER — INSULIN ASPART 100 UNIT/ML ~~LOC~~ SOLN
0.0000 [IU] | Freq: Three times a day (TID) | SUBCUTANEOUS | Status: DC
  Administered 2017-11-30: 3 [IU] via SUBCUTANEOUS
  Administered 2017-12-01 (×3): 2 [IU] via SUBCUTANEOUS
  Administered 2017-12-02: 3 [IU] via SUBCUTANEOUS
  Administered 2017-12-02 (×2): 2 [IU] via SUBCUTANEOUS
  Administered 2017-12-03 – 2017-12-04 (×4): 1 [IU] via SUBCUTANEOUS
  Administered 2017-12-04 – 2017-12-05 (×2): 2 [IU] via SUBCUTANEOUS
  Administered 2017-12-05 – 2017-12-06 (×3): 1 [IU] via SUBCUTANEOUS
  Administered 2017-12-06 – 2017-12-07 (×2): 2 [IU] via SUBCUTANEOUS
  Administered 2017-12-07 (×2): 1 [IU] via SUBCUTANEOUS

## 2017-11-30 MED ORDER — MELATONIN 3 MG PO TABS
3.0000 mg | ORAL_TABLET | Freq: Every day | ORAL | Status: DC
  Administered 2017-11-30 – 2017-12-07 (×8): 3 mg via ORAL
  Filled 2017-11-30 (×9): qty 1

## 2017-11-30 MED ORDER — SODIUM CHLORIDE 0.9 % IV SOLN
INTRAVENOUS | Status: DC
  Administered 2017-11-30: 10:00:00 via INTRAVENOUS

## 2017-11-30 MED ORDER — METHOCARBAMOL 500 MG PO TABS
500.0000 mg | ORAL_TABLET | Freq: Three times a day (TID) | ORAL | Status: DC | PRN
  Administered 2017-12-02: 500 mg via ORAL
  Filled 2017-11-30: qty 1

## 2017-11-30 MED ORDER — TAMSULOSIN HCL 0.4 MG PO CAPS
0.4000 mg | ORAL_CAPSULE | Freq: Every day | ORAL | Status: DC
  Administered 2017-12-01 – 2017-12-07 (×6): 0.4 mg via ORAL
  Filled 2017-11-30 (×8): qty 1

## 2017-11-30 MED ORDER — ONDANSETRON HCL 4 MG/2ML IJ SOLN: 4.0000 mg | Freq: Three times a day (TID) | INTRAMUSCULAR | Status: DC | PRN

## 2017-11-30 NOTE — Progress Notes (Signed)
Patient with oral temp of 102.  Dr. Veverly Fells notified and asked this RN to contact infectious disease for consult.  ID contacted and orders obtained for blood cultures.  Surgery postponed pending blood culture results,

## 2017-11-30 NOTE — Consult Note (Signed)
Reason for Consult:Left hip fracture Referring Physician: Blaine Hamper MD  [facility] is an 84 y.o. male.  HPI: 84 yo male who presents after a fall complaining of left hip pain with weight bearing. By report, the patient initially was able walk on this injury but after an hour had increasing pain and presented to the [facility] for evaluation, XRAYs were negative but a CT scan of the pelvis showed a minimally displaced and impacted femoral neck fracture. The patient denies any other complaints. He is transported to [facility] for further eval and treatment.  Past Medical History:  Diagnosis Date  . Acoustic neuroma ([place])   . Barretts esophagus   . Depression   . Diabetes mellitus without complication ([place])   . Edema   . GERD (gastroesophageal reflux disease)   . Kidney infection   . Neuropathy   . Spinal stenosis   . Trigeminal neuralgia     Past Surgical History:  Procedure Laterality Date  . APPENDECTOMY    . BRAIN SURGERY    . HERNIA REPAIR      Family History  Problem Relation Age of Onset  . Hypertension Other     Social History:  reports that he has quit smoking. He has never used smokeless tobacco. He reports that he does not drink alcohol or use drugs.  Allergies:  Allergies  Allergen Reactions  . Aspirin   . Fentanyl Rash    Medications: I have reviewed the patient's current medications.  Results for orders placed or performed during the hospital encounter of 11/29/17 (from the past 48 hour(s))  Basic metabolic panel     Status: Abnormal   Collection Time: 11/29/17  6:39 PM  Result Value Ref Range   Sodium 136 135 - 145 mmol/L   Potassium 4.3 3.5 - 5.1 mmol/L   Chloride 107 98 - 111 mmol/L   CO2 20 (L) 22 - 32 mmol/L   Glucose, Bld 118 (H) 70 - 99 mg/dL   BUN 25 (H) 8 - 23 mg/dL   Creatinine, Ser 0.81 0.61 - 1.24 mg/dL   Calcium 8.9 8.9 - 10.3 mg/dL   GFR calc non Af Amer >60 >60 mL/min   GFR calc Af Amer >60 >60 mL/min    Comment: (NOTE) The eGFR  has been calculated using the CKD EPI equation. This calculation has not been validated in all clinical situations. eGFR's persistently <60 mL/min signify possible Chronic Kidney Disease.    Anion gap 9 5 - 15    Comment: Performed at [facility], [address] [place], [place] [address]  CBC WITH DIFFERENTIAL     Status: Abnormal   Collection Time: 11/29/17  6:39 PM  Result Value Ref Range   WBC 11.3 (H) 4.0 - 10.5 K/uL   RBC 3.26 (L) 4.22 - 5.81 MIL/uL   Hemoglobin 12.2 (L) 13.0 - 17.0 g/dL   HCT 36.6 (L) 39.0 - 52.0 %   MCV 112.3 (H) 80.0 - 100.0 fL   MCH 37.4 (H) 26.0 - 34.0 pg   MCHC 33.3 30.0 - 36.0 g/dL   RDW 13.9 11.5 - 15.5 %   Platelets 209 150 - 400 K/uL   nRBC 0.0 0.0 - 0.2 %   Neutrophils Relative % 81 %   Neutro Abs 9.2 (H) 1.7 - 7.7 K/uL   Lymphocytes Relative 10 %   Lymphs Abs 1.1 0.7 - 4.0 K/uL   Monocytes Relative 6 %   Monocytes Absolute  0.7 0.1 - 1.0 K/uL   Eosinophils Relative 1 %   Eosinophils Absolute 0.1 0.0 - 0.5 K/uL   Basophils Relative 1 %   Basophils Absolute 0.1 0.0 - 0.1 K/uL   Immature Granulocytes 1 %   Abs Immature Granulocytes 0.09 (H) 0.00 - 0.07 K/uL   Ovalocytes PRESENT     Comment: Performed at [facility], [address] [place], [place] [address]  Protime-INR     Status: None   Collection Time: 11/29/17  6:39 PM  Result Value Ref Range   Prothrombin Time 13.8 11.4 - 15.2 seconds   INR 1.07     Comment: Performed at [facility], [address] [place], [place] [address]    Dg Chest 2 View  Result Date: 11/29/2017 CLINICAL DATA:  84 year old male status post fall with left hip fracture and hypoxia. Former smoker. EXAM: CHEST - 2 VIEW COMPARISON:  05/03/2017 FINDINGS: Cardiomegaly with tortuous atherosclerotic aorta. Pulmonary consolidation at the left base suspicious for pneumonia and/or atelectasis. Mild interstitial edema is noted. Subtle cortical discontinuity of the right mid and lower  scapular cortex may represent subtle fractures. Correlate for pain and if needed dedicated scapular views are suggested. IMPRESSION: Cardiomegaly with pulmonary consolidation at the left lung base suspicious for atelectasis and/or pneumonia. Mild vascular congestion. Slight cortical irregularity of the mid and lower right scapula query fracture. Electronically Signed   By: Ashley Royalty M.D.   On: 11/29/2017 19:58   Ct Hip Left Wo Contrast  Result Date: 11/29/2017 CLINICAL DATA:  Patient fell onto left side while getting out of bed this morning and presents complaining of left hip pain. EXAM: CT OF THE LEFT HIP WITHOUT CONTRAST TECHNIQUE: Multidetector CT imaging of the left hip was performed according to the standard protocol. Multiplanar CT image reconstructions were also generated. COMPARISON:  Plain radiographs from earlier today. FINDINGS: Bones/Joint/Cartilage Acute, closed, slightly impacted subcapital left femoral neck fracture with slight impaction along the superolateral aspect of the femoral head-neck junction. No joint dislocation or effusion. The adjacent acetabulum and included pubic rami appear intact. There is mild chondral thinning consistent with osteoarthritis. Ligaments Suboptimally assessed by CT. Muscles and Tendons Negative Soft tissues Soft tissue contusion along the lateral aspect of the left hip edema. No focal fluid collection to suggest hematoma. IMPRESSION: Acute subcapital left femoral neck fracture with slight impaction along the superolateral aspect of the femoral head-neck junction. Electronically Signed   By: Ashley Royalty M.D.   On: 11/29/2017 17:35   Dg Knee Complete 4 Views Left  Result Date: 11/29/2017 CLINICAL DATA:  Left knee pain after fall EXAM: LEFT KNEE - COMPLETE 4+ VIEW COMPARISON:  None. FINDINGS: Mild-to-moderate femorotibial joint space narrowing. Trace joint effusion. No acute fracture or malalignment of the left knee. IMPRESSION: Degenerative joint space  narrowing of the femorotibial compartment. No acute osseous abnormality. Electronically Signed   By: Ashley Royalty M.D.   On: 11/29/2017 17:40   Dg Hip Unilat With Pelvis 2-3 Views Left  Result Date: 11/29/2017 CLINICAL DATA:  [place] with pain in the left hip. Limited range of motion. EXAM: DG HIP (WITH OR WITHOUT PELVIS) 2-3V LEFT COMPARISON:  None. FINDINGS: There is no evidence of hip fracture or dislocation. There is no evidence of arthropathy or other focal bone abnormality. IMPRESSION: Negative. Electronically Signed   By: Nelson Chimes M.D.   On: 11/29/2017 16:18    ROS Blood pressure 135/68, pulse 67, temperature 98.4 F (36.9  C), temperature source Oral, resp. rate 17, height 5' 10"  (1.778 m), weight 71.2 kg, SpO2 94 %. Physical Exam neck is non tender, normal AROM. Bilateral shoulders non tender and no pain with gentle ROM, bilateral elbows and wrists with no pain with PROM and AROM. Left elbow and wrist with some abrasions which are bandaged. T and L spine nontender.  Right LE no pain with AROM of the hip and knee and ankle. 5/5 motor strength distally, sensation intact distally, mild dependent edema and intact pulses Left LE with mild pain initially with AROM of the hip but no pain with passive flexion and logrolling. Knee and ankle nontender and no pain with AROM, good pulses and intact sensation  Assessment/Plan: Minimally displaced valgus impacted femoral neck fracture on the left hip. Would like to discuss treatment options with the family. He is very hard of hearing but understands he injured his hip and may benefit from surgical stabilization of the hip. At most the patient would require percutaneous cannulated screw fixation of the hip. This may allow for improved mobilization and would decrease the risk of fracture displacement.  Seung Nidiffer,STEVEN R 11/30/2017, 12:04 AM

## 2017-11-30 NOTE — H&P (Signed)
History and Physical  Saagar Losh UUV:[id number] DOB: [birth year] DOA: 11/29/2017  Referring physician: EDP PCP: Drake Leach, MD  Patient coming from: Home & is able to ambulate with walker  Chief Complaint: Mechanical fall with L hip pain   HPI: Terry Harrell is a 84 y.o. male with medical history significant for acoustic neuroma, trigeminal neuralgia, diabetes, GERD presents to the ED complaining of left hip pain after mechanical fall at home.  History provided by wife as patient is extremely hard of hearing.  Wife stated she normally assists patient during ambulation, but while she was in the bathroom, patient decided to ambulate unassisted, resulting to him falling on his side, denies hitting his head or losing consciousness.  Patient was okay ambulating for about an hour after the fall, later complained of significant left hip pain and was unable to ambulate, hence bringing him to the ED.  Patient denies any chest pain, shortness of breath, palpitations, dizziness, abdominal pain, nausea/vomiting, diarrhea, cough, fever/chills.  Wife stated patient is very prone to falling if he gets up unassisted.  ED Course: Vital signs noted to be stable, WBC noted to be 11.3.  CT left hip showed acute subcapital left femoral neck fracture.  Orthopedics consulted.  Patient admitted for further management.  Review of Systems: Review of systems are otherwise negative   Past Medical History:  Diagnosis Date  . Acoustic neuroma ([place])   . Barretts esophagus   . Depression   . Diabetes mellitus without complication ([place])   . Edema   . GERD (gastroesophageal reflux disease)   . Kidney infection   . Neuropathy   . Spinal stenosis   . Trigeminal neuralgia    Past Surgical History:  Procedure Laterality Date  . APPENDECTOMY    . BRAIN SURGERY    . HERNIA REPAIR      Social History:  reports that he has quit smoking. He has never used smokeless tobacco. He reports that he does not drink alcohol or  use drugs.   Allergies  Allergen Reactions  . Aspirin Other (See Comments)    Per family aspirin causes him to bleed  . Fentanyl Rash    Family History  Problem Relation Age of Onset  . Hypertension Other       Prior to Admission medications   Medication Sig Start Date End Date Taking? Authorizing Provider  acetaminophen (TYLENOL) 325 MG tablet Take 2 tablets (650 mg total) by mouth every 6 (six) hours as needed for mild pain (or Fever >/= 101). 05/03/17  Yes Vann, Jessica U, DO  carbamazepine (TEGRETOL) 100 MG chewable tablet Chew 100 mg by mouth daily.   Yes [provider]  docusate sodium (COLACE) 100 MG capsule Take 100 mg by mouth daily.   Yes [provider]  gabapentin (NEURONTIN) 300 MG capsule Take 300 mg by mouth at bedtime. 07/28/13  Yes [provider]  Melatonin 5 MG TABS Take 5 mg by mouth at bedtime.   Yes [provider]  metFORMIN (GLUCOPHAGE) 500 MG tablet Take 500 mg by mouth 2 (two) times daily with a meal.   Yes [provider]  omeprazole (PRILOSEC) 20 MG capsule Take 20 mg by mouth daily.   Yes [provider]  psyllium (REGULOID) 0.52 g capsule Take 0.52 g by mouth daily.   Yes [provider]  tamsulosin (FLOMAX) 0.4 MG CAPS capsule Take 0.4 mg by mouth daily.    Yes [provider]  HYDROcodone-acetaminophen (NORCO/VICODIN) 5-325 MG  tablet Take 1 tablet by mouth every 6 (six) hours as needed for severe pain. Patient not taking: Reported on 11/30/2017 05/03/17   Geradine Girt, DO    Physical Exam: BP 127/89 (BP Location: Left Arm)   Pulse 78   Temp 98.4 F (36.9 C) (Oral)   Resp 18   Ht 5\' 10"  (1.778 m)   Wt 71.2 kg   SpO2 93%   BMI 22.53 kg/m   General: NAD, sleepy Eyes: Normal ENT: Very hard of hearing, otherwise normal Neck: Supple Cardiovascular: S1, S2 present Respiratory: CTA B Abdomen: Soft, nontender, nondistended, bowel sounds present Skin:  Normal Musculoskeletal: Noted chronic lymphedema bilaterally Psychiatric: Normal mood Neurologic: No focal neurologic deficits noted          Labs on Admission:  Basic Metabolic Panel: Recent Labs  Lab 11/29/17 1839  NA 136  K 4.3  CL 107  CO2 20*  GLUCOSE 118*  BUN 25*  CREATININE 0.81  CALCIUM 8.9   Liver Function Tests: No results for input(s): AST, ALT, ALKPHOS, BILITOT, PROT, ALBUMIN in the last 168 hours. No results for input(s): LIPASE, AMYLASE in the last 168 hours. No results for input(s): AMMONIA in the last 168 hours. CBC: Recent Labs  Lab 11/29/17 1839  WBC 11.3*  NEUTROABS 9.2*  HGB 12.2*  HCT 36.6*  MCV 112.3*  PLT 209   Cardiac Enzymes: No results for input(s): CKTOTAL, CKMB, CKMBINDEX, TROPONINI in the last 168 hours.  BNP (last 3 results) No results for input(s): BNP in the last 8760 hours.  ProBNP (last 3 results) No results for input(s): PROBNP in the last 8760 hours.  CBG: Recent Labs  Lab 11/30/17 1152  GLUCAP 129*    Radiological Exams on Admission: Dg Chest 2 View  Result Date: 11/29/2017 CLINICAL DATA:  84 year old male status post fall with left hip fracture and hypoxia. Former smoker. EXAM: CHEST - 2 VIEW COMPARISON:  05/03/2017 FINDINGS: Cardiomegaly with tortuous atherosclerotic aorta. Pulmonary consolidation at the left base suspicious for pneumonia and/or atelectasis. Mild interstitial edema is noted. Subtle cortical discontinuity of the right mid and lower scapular cortex may represent subtle fractures. Correlate for pain and if needed dedicated scapular views are suggested. IMPRESSION: Cardiomegaly with pulmonary consolidation at the left lung base suspicious for atelectasis and/or pneumonia. Mild vascular congestion. Slight cortical irregularity of the mid and lower right scapula query fracture. Electronically Signed   By: Ashley Royalty M.D.   On: 11/29/2017 19:58   Ct Hip Left Wo Contrast  Result Date: 11/29/2017 CLINICAL  DATA:  Patient fell onto left side while getting out of bed this morning and presents complaining of left hip pain. EXAM: CT OF THE LEFT HIP WITHOUT CONTRAST TECHNIQUE: Multidetector CT imaging of the left hip was performed according to the standard protocol. Multiplanar CT image reconstructions were also generated. COMPARISON:  Plain radiographs from earlier today. FINDINGS: Bones/Joint/Cartilage Acute, closed, slightly impacted subcapital left femoral neck fracture with slight impaction along the superolateral aspect of the femoral head-neck junction. No joint dislocation or effusion. The adjacent acetabulum and included pubic rami appear intact. There is mild chondral thinning consistent with osteoarthritis. Ligaments Suboptimally assessed by CT. Muscles and Tendons Negative Soft tissues Soft tissue contusion along the lateral aspect of the left hip edema. No focal fluid collection to suggest hematoma. IMPRESSION: Acute subcapital left femoral neck fracture with slight impaction along the superolateral aspect of the femoral head-neck junction. Electronically Signed   By: Meredith Leeds.D.  On: 11/29/2017 17:35   Dg Knee Complete 4 Views Left  Result Date: 11/29/2017 CLINICAL DATA:  Left knee pain after fall EXAM: LEFT KNEE - COMPLETE 4+ VIEW COMPARISON:  None. FINDINGS: Mild-to-moderate femorotibial joint space narrowing. Trace joint effusion. No acute fracture or malalignment of the left knee. IMPRESSION: Degenerative joint space narrowing of the femorotibial compartment. No acute osseous abnormality. Electronically Signed   By: Ashley Royalty M.D.   On: 11/29/2017 17:40   Dg Hip Unilat With Pelvis 2-3 Views Left  Result Date: 11/29/2017 CLINICAL DATA:  [place] with pain in the left hip. Limited range of motion. EXAM: DG HIP (WITH OR WITHOUT PELVIS) 2-3V LEFT COMPARISON:  None. FINDINGS: There is no evidence of hip fracture or dislocation. There is no evidence of arthropathy or other focal bone abnormality.  IMPRESSION: Negative. Electronically Signed   By: Nelson Chimes M.D.   On: 11/29/2017 16:18    EKG: Pending  Assessment/Plan Present on Admission: . Closed left hip fracture, initial encounter ([place]) . Fall . GERD (gastroesophageal reflux disease) . Trigeminal neuralgia . BPH (benign prostatic hyperplasia)  Principal Problem:   Closed left hip fracture, initial encounter [facility]) Active Problems:   Fall   GERD (gastroesophageal reflux disease)   Diabetes mellitus without complication (H[facility])   Trigeminal neuralgia   BPH (benign prostatic hyperplasia)  Acute subcapital left femoral neck fracture S/P mechanical fall CT left hip showed above Orthopedics on board, plan for percutaneous cannulated screw fixation DVT PPx, pain management, PT/OT per Ortho  Leukocytosis Likely due to reactive from above CBC  Diabetes mellitus type 2 with neuropathy SSI, Accu-Cheks Continue home gabapentin Hold home metformin  GERD Continue PPI  Trigeminal neuralgia Continue home carbamazepine  BPH Continue Flomax     DVT prophylaxis: SCDs  Code Status: Partial code  Family Communication: Discussed with son and wife at bedside  Disposition Plan: To be determined  Consults called: Orthopedics  Admission status: Inpatient    Alma Friendly MD [facility]   If 7PM-7AM, please contact night-coverage [email or web address]   11/30/2017, 12:18 PM

## 2017-11-30 NOTE — Consult Note (Signed)
[place] [place] for Infectious Disease  Total days of antibiotics 1         Reason for Consult: fever   Referring Physician: [email or web address]  Principal Problem:   Closed left hip fracture, initial encounter [facility]) Active Problems:   Fall   GERD (gastroesophageal reflux disease)   Diabetes mellitus without complication (HCC)   Trigeminal neuralgia   BPH (benign prostatic hyperplasia)    HPI: Orion Niesen is a 84 y.o. male who has hx of fall, DM, GERD, dementia. He was admitted for left hip fracture sustained from fall in the bathroom. He was to go to OR but was found to have high fever of 103F. On admit, team had difficulty placing his foley, thus dark urine noted in foley bag. Patient and his family deny any recent fever, chills, dysuria, cough, sick contact, though he does have frequent urination possibly from BPH.  This summer he had multiple courses of cipro by his urologist for recurrent uti  Past Medical History:  Diagnosis Date  . Acoustic neuroma ([place])   . Barretts esophagus   . Depression   . Diabetes mellitus without complication ([place])   . Edema   . GERD (gastroesophageal reflux disease)   . Kidney infection   . Neuropathy   . Spinal stenosis   . Trigeminal neuralgia     Allergies:  Allergies  Allergen Reactions  . Aspirin Other (See Comments)    Per family aspirin causes him to bleed  . Fentanyl Rash     MEDICATIONS: . carbamazepine  100 mg Oral QHS  . chlorhexidine  60 mL Topical Once  . gabapentin  300 mg Oral QHS  . insulin aspart  0-9 Units Subcutaneous TID WC  . Melatonin  3 mg Oral QHS  . pantoprazole  40 mg Oral Daily  . senna  1 tablet Oral BID  . tamsulosin  0.4 mg Oral Daily    Social History   Tobacco Use  . Smoking status: Former Research scientist (life sciences)  . Smokeless tobacco: Never Used  Substance Use Topics  . Alcohol use: No    Frequency: Never  . Drug use: No    Family History  Problem Relation Age of Onset  . Hypertension Other      Review of  Systems  Constitutional: Negative for fever, chills, diaphoresis, activity change, appetite change, fatigue and unexpected weight change.  HENT: Negative for congestion, sore throat, rhinorrhea, sneezing, trouble swallowing and sinus pressure.  Eyes: Negative for photophobia and visual disturbance.  Respiratory: Negative for cough, chest tightness, shortness of breath, wheezing and stridor.  Cardiovascular: Negative for chest pain, palpitations and leg swelling.  Gastrointestinal: Negative for nausea, vomiting, abdominal pain, diarrhea, constipation, blood in stool, abdominal distention and anal bleeding.  Genitourinary: Negative for dysuria, hematuria, flank pain and difficulty urinating.  Musculoskeletal: +hip pain Skin: Negative for color change, pallor, rash and wound.  Neurological: Negative for dizziness, tremors, weakness and light-headedness.  Hematological: Negative for adenopathy. Does not bruise/bleed easily.  Psychiatric/Behavioral: +nightmares    OBJECTIVE: Temp:  [98.2 F (36.8 C)-102.1 F (38.9 C)] 102.1 F (38.9 C) (10/18 1239) Pulse Rate:  [58-97] 97 (10/18 1239) Resp:  [16-21] 18 (10/18 1239) BP: (106-143)/(59-96) 136/79 (10/18 1239) SpO2:  [91 %-94 %] 93 % (10/18 1239) Physical Exam  Constitutional: He is oriented to person, place,hard of hearing He appears well-developed and well-nourished. No distress.  HENT:  Mouth/Throat: Oropharynx is clear and moist. No oropharyngeal exudate.  Cardiovascular: Normal rate, regular rhythm and  normal heart sounds. Exam reveals no gallop and no friction rub.  No murmur heard.  Chest wall: mild pectus excavatum Pulmonary/Chest: Effort normal and breath sounds normal. No respiratory distress. He has no wheezes.  Abdominal: Soft. Bowel sounds are normal. He exhibits no distension. There is no tenderness.  Lymphadenopathy:  He has no cervical adenopathy.  Neurological: He is alert and oriented to person, place, and time.  Skin:  Skin is warm and dry. No rash noted. No erythema.  Psychiatric: He has a normal mood and affect. His behavior is normal.    LABS: Results for orders placed or performed during the hospital encounter of 11/29/17 (from the past 48 hour(s))  Basic metabolic panel     Status: Abnormal   Collection Time: 11/29/17  6:39 PM  Result Value Ref Range   Sodium 136 135 - 145 mmol/L   Potassium 4.3 3.5 - 5.1 mmol/L   Chloride 107 98 - 111 mmol/L   CO2 20 (L) 22 - 32 mmol/L   Glucose, Bld 118 (H) 70 - 99 mg/dL   BUN 25 (H) 8 - 23 mg/dL   Creatinine, Ser 0.81 0.61 - 1.24 mg/dL   Calcium 8.9 8.9 - 10.3 mg/dL   GFR calc non Af Amer >60 >60 mL/min   GFR calc Af Amer >60 >60 mL/min    Comment: (NOTE) The eGFR has been calculated using the CKD EPI equation. This calculation has not been validated in all clinical situations. eGFR's persistently <60 mL/min signify possible Chronic Kidney Disease.    Anion gap 9 5 - 15    Comment: Performed at [facility], [address] [place], [place] [address]  CBC WITH DIFFERENTIAL     Status: Abnormal   Collection Time: 11/29/17  6:39 PM  Result Value Ref Range   WBC 11.3 (H) 4.0 - 10.5 K/uL   RBC 3.26 (L) 4.22 - 5.81 MIL/uL   Hemoglobin 12.2 (L) 13.0 - 17.0 g/dL   HCT 36.6 (L) 39.0 - 52.0 %   MCV 112.3 (H) 80.0 - 100.0 fL   MCH 37.4 (H) 26.0 - 34.0 pg   MCHC 33.3 30.0 - 36.0 g/dL   RDW 13.9 11.5 - 15.5 %   Platelets 209 150 - 400 K/uL   nRBC 0.0 0.0 - 0.2 %   Neutrophils Relative % 81 %   Neutro Abs 9.2 (H) 1.7 - 7.7 K/uL   Lymphocytes Relative 10 %   Lymphs Abs 1.1 0.7 - 4.0 K/uL   Monocytes Relative 6 %   Monocytes Absolute 0.7 0.1 - 1.0 K/uL   Eosinophils Relative 1 %   Eosinophils Absolute 0.1 0.0 - 0.5 K/uL   Basophils Relative 1 %   Basophils Absolute 0.1 0.0 - 0.1 K/uL   Immature Granulocytes 1 %   Abs Immature Granulocytes 0.09 (H) 0.00 - 0.07 K/uL   Ovalocytes PRESENT     Comment: Performed at [facility], [address] [place], [place] [address]  Protime-INR     Status: None   Collection Time: 11/29/17  6:39 PM  Result Value Ref Range   Prothrombin Time 13.8 11.4 - 15.2 seconds   INR 1.07     Comment: Performed at [facility], [address] [place], [place] [address]  MRSA PCR Screening     Status: None   Collection Time: 11/29/17 11:55 PM  Result Value Ref Range   MRSA by PCR NEGATIVE NEGATIVE  Comment:        The GeneXpert MRSA Assay (FDA approved for NASAL specimens only), is one component of a comprehensive MRSA colonization surveillance program. It is not intended to diagnose MRSA infection nor to guide or monitor treatment for MRSA infections. Performed at [facility], [place] [address], [place], [place] [address]   APTT     Status: None   Collection Time: 11/30/17 12:49 AM  Result Value Ref Range   aPTT 29 24 - 36 seconds    Comment: Performed at [facility] [address] [place], [place] [address]  Type and screen [place]     Status: None   Collection Time: 11/30/17 12:57 AM  Result Value Ref Range   ABO/RH(D) A POS    Antibody Screen NEG    Sample Expiration      12/03/2017 Performed at [facility], [place] [address], [place], [place] [address]   ABO/Rh     Status: None   Collection Time: 11/30/17 12:57 AM  Result Value Ref Range   ABO/RH(D)      A POS Performed at [facility] [address], [place], [place] [address]   Glucose, capillary     Status: Abnormal   Collection Time: 11/30/17 11:52 AM  Result Value Ref Range   Glucose-Capillary 129 (H) 70 - 99 mg/dL  Urinalysis, Routine w reflex microscopic     Status: Abnormal   Collection Time: 11/30/17  1:57 PM  Result Value Ref Range   Color, Urine AMBER (A) YELLOW    Comment: BIOCHEMICALS MAY BE AFFECTED BY COLOR   APPearance CLOUDY (A) CLEAR   Specific Gravity, Urine 1.013 1.005 - 1.030   pH 7.0 5.0 - 8.0   Glucose, UA NEGATIVE NEGATIVE  mg/dL   Hgb urine dipstick LARGE (A) NEGATIVE   Bilirubin Urine NEGATIVE NEGATIVE   Ketones, ur NEGATIVE NEGATIVE mg/dL   Protein, ur 30 (A) NEGATIVE mg/dL   Nitrite NEGATIVE NEGATIVE   Leukocytes, UA TRACE (A) NEGATIVE   RBC / HPF >50 (H) 0 - 5 RBC/hpf   WBC, UA 6-10 0 - 5 WBC/hpf   Bacteria, UA FEW (A) NONE SEEN   Mucus PRESENT     Comment: Performed at [facility], [address] [place], [place] [address]  Lactic acid, plasma     Status: Abnormal   Collection Time: 11/30/17  2:15 PM  Result Value Ref Range   Lactic Acid, Venous 2.1 (HH) 0.5 - 1.9 mmol/L    Comment: CRITICAL RESULT CALLED TO, READ BACK BY AND VERIFIED WITH: D. COX RN [id number] GREEN R Performed at [facility] [address] [place], [place] [address]   Glucose, capillary     Status: Abnormal   Collection Time: 11/30/17  4:13 PM  Result Value Ref Range   Glucose-Capillary 205 (H) 70 - 99 mg/dL    MICRO: reviewed IMAGING: Dg Chest 2 View  Result Date: 11/29/2017 CLINICAL DATA:  84 year old male status post fall with left hip fracture and hypoxia. Former smoker. EXAM: CHEST - 2 VIEW COMPARISON:  05/03/2017 FINDINGS: Cardiomegaly with tortuous atherosclerotic aorta. Pulmonary consolidation at the left base suspicious for pneumonia and/or atelectasis. Mild interstitial edema is noted. Subtle cortical discontinuity of the right mid and lower scapular cortex may represent subtle fractures. Correlate for pain and if needed dedicated scapular views are suggested. IMPRESSION: Cardiomegaly with pulmonary consolidation at the left lung base suspicious  for atelectasis and/or pneumonia. Mild vascular congestion. Slight cortical irregularity of the mid and lower right scapula query fracture. Electronically Signed   By: Ashley Royalty M.D.   On: 11/29/2017 19:58   Ct Hip Left Wo Contrast  Result Date: 11/29/2017 CLINICAL DATA:  Patient fell onto left side while getting out of bed this morning and presents  complaining of left hip pain. EXAM: CT OF THE LEFT HIP WITHOUT CONTRAST TECHNIQUE: Multidetector CT imaging of the left hip was performed according to the standard protocol. Multiplanar CT image reconstructions were also generated. COMPARISON:  Plain radiographs from earlier today. FINDINGS: Bones/Joint/Cartilage Acute, closed, slightly impacted subcapital left femoral neck fracture with slight impaction along the superolateral aspect of the femoral head-neck junction. No joint dislocation or effusion. The adjacent acetabulum and included pubic rami appear intact. There is mild chondral thinning consistent with osteoarthritis. Ligaments Suboptimally assessed by CT. Muscles and Tendons Negative Soft tissues Soft tissue contusion along the lateral aspect of the left hip edema. No focal fluid collection to suggest hematoma. IMPRESSION: Acute subcapital left femoral neck fracture with slight impaction along the superolateral aspect of the femoral head-neck junction. Electronically Signed   By: Ashley Royalty M.D.   On: 11/29/2017 17:35   Dg Knee Complete 4 Views Left  Result Date: 11/29/2017 CLINICAL DATA:  Left knee pain after fall EXAM: LEFT KNEE - COMPLETE 4+ VIEW COMPARISON:  None. FINDINGS: Mild-to-moderate femorotibial joint space narrowing. Trace joint effusion. No acute fracture or malalignment of the left knee. IMPRESSION: Degenerative joint space narrowing of the femorotibial compartment. No acute osseous abnormality. Electronically Signed   By: Ashley Royalty M.D.   On: 11/29/2017 17:40   Dg Hip Unilat With Pelvis 2-3 Views Left  Result Date: 11/29/2017 CLINICAL DATA:  [address] [address] with pain in the left hip. Limited range of motion. EXAM: DG HIP (WITH OR WITHOUT PELVIS) 2-3V LEFT COMPARISON:  None. FINDINGS: There is no evidence of hip fracture or dislocation. There is no evidence of arthropathy or other focal bone abnormality. IMPRESSION: Negative. Electronically Signed   By: Nelson Chimes M.D.   On: 11/29/2017  16:18    HISTORICAL MICRO/IMAGING  Assessment/Plan:  84yo M with no previous symptoms concerning for infection, sustains left hip fracture but now having fevers. Possibly urogenital flora disruption when having foley insertion could be possible cause  - recommend blood cx - recommend ceftriaxone for possibly uti?  - would check CK and LFT given concern for rhabdo - cxr not impressive for cap  Dr Linus Salmons to provider further recs

## 2017-11-30 NOTE — Progress Notes (Signed)
Orthopedics Progress Note  Subjective: Patient reports some difficulty with taking a deep breath and also some ongoing hip pain on the left.  Objective:  Vitals:   11/30/17 0359 11/30/17 1239  BP: 127/89 136/79  Pulse: 78 97  Resp: 18 18  Temp:  (!) 102.1 F (38.9 C)  SpO2: 93% 93%    General: Awake and alert  Musculoskeletal: some pain with AROM of the left hip Leg lengths remain equal No cords Abdomen somewhat distended but not tender Neurovascularly intact  Lab Results  Component Value Date   WBC 11.3 (H) 11/29/2017   HGB 12.2 (L) 11/29/2017   HCT 36.6 (L) 11/29/2017   MCV 112.3 (H) 11/29/2017   PLT 209 11/29/2017       Component Value Date/Time   NA 136 11/29/2017 1839   K 4.3 11/29/2017 1839   CL 107 11/29/2017 1839   CO2 20 (L) 11/29/2017 1839   GLUCOSE 118 (H) 11/29/2017 1839   BUN 25 (H) 11/29/2017 1839   CREATININE 0.81 11/29/2017 1839   CALCIUM 8.9 11/29/2017 1839   GFRNONAA >60 11/29/2017 1839   GFRAA >60 11/29/2017 1839    Lab Results  Component Value Date   INR 1.07 11/29/2017    Assessment/Plan: Left hip fracture with new onset fever to 102 and elevated WBC Fever work up starting ? Pneumonia, ? UTI Surgery cancelled at this time. OK to eat I spoke with primary care team who is taking lead on this  Terry Harrell. Terry Fells, MD 11/30/2017 2:02 PM

## 2017-11-30 NOTE — Progress Notes (Addendum)
Orthopedics Progress Note  Subjective: Patient remains in bed comfortable  Objective:  Vitals:   11/29/17 2344 11/30/17 0359  BP: 135/68 127/89  Pulse: 67 78  Resp: 17 18  Temp: 98.4 F (36.9 C)   SpO2: 94% 93%    General: Awake and alert  Musculoskeletal: leg lengths equal and NVI distally Neurovascularly intact  Lab Results  Component Value Date   WBC 11.3 (H) 11/29/2017   HGB 12.2 (L) 11/29/2017   HCT 36.6 (L) 11/29/2017   MCV 112.3 (H) 11/29/2017   PLT 209 11/29/2017       Component Value Date/Time   NA 136 11/29/2017 1839   K 4.3 11/29/2017 1839   CL 107 11/29/2017 1839   CO2 20 (L) 11/29/2017 1839   GLUCOSE 118 (H) 11/29/2017 1839   BUN 25 (H) 11/29/2017 1839   CREATININE 0.81 11/29/2017 1839   CALCIUM 8.9 11/29/2017 1839   GFRNONAA >60 11/29/2017 1839   GFRAA >60 11/29/2017 1839    Lab Results  Component Value Date   INR 1.07 11/29/2017    Assessment/Plan: Left femoral neck fracture I spoke with the patient's son and daughter and explained the options for treatment recommending percutaneous cannulated screw fixation to decrease pain in the hip and to prevent displacement of the fracture, they agree with this plan. Patient is NPO and orders for consent in [place]. Veverly Fells, MD 11/30/2017 9:59 AM

## 2017-11-30 NOTE — Progress Notes (Signed)
CRITICAL VALUE ALERT  Critical Value:  Lactic Acid 3.9  Date & Time Notied:  10/18 2123  Provider Notified: [facility]  Orders Received/Actions taken: NaCl bolus 500

## 2017-11-30 NOTE — Progress Notes (Signed)
MD notified of critical lactic acid level 2.1

## 2017-11-30 NOTE — Care Management (Signed)
This is a no charge note  Accepted by Dr. Dr. Hal Hope  84 year old male with past medical history of diabetes mellitus, GERD, depression, acoustic neuroma, trigeminal neuralgia, BPH, who presents wit a fall and left hip fracture.  Orthopedic surgeon was consulted.  Patient is admitted to telemetry bed as inpatient.    Ivor Costa, MD  [facility] Pager [phone]  If 7PM-7AM, please contact night-coverage [email or web address] Password [email or web address] 11/30/2017, 2:50 AM

## 2017-12-01 ENCOUNTER — Inpatient Hospital Stay (HOSPITAL_COMMUNITY): Payer: Medicare Other

## 2017-12-01 DIAGNOSIS — A419 Sepsis, unspecified organism: Secondary | ICD-10-CM

## 2017-12-01 DIAGNOSIS — J189 Pneumonia, unspecified organism: Secondary | ICD-10-CM

## 2017-12-01 DIAGNOSIS — N179 Acute kidney failure, unspecified: Secondary | ICD-10-CM

## 2017-12-01 DIAGNOSIS — I5030 Unspecified diastolic (congestive) heart failure: Secondary | ICD-10-CM

## 2017-12-01 LAB — URINE CULTURE

## 2017-12-01 LAB — HEPATIC FUNCTION PANEL
ALK PHOS: 91 U/L (ref 38–126)
ALT: 42 U/L (ref 0–44)
AST: 27 U/L (ref 15–41)
Albumin: 2.6 g/dL — ABNORMAL LOW (ref 3.5–5.0)
BILIRUBIN TOTAL: 1.1 mg/dL (ref 0.3–1.2)
Bilirubin, Direct: 0.4 mg/dL — ABNORMAL HIGH (ref 0.0–0.2)
Indirect Bilirubin: 0.7 mg/dL (ref 0.3–0.9)
Total Protein: 5.6 g/dL — ABNORMAL LOW (ref 6.5–8.1)

## 2017-12-01 LAB — LACTIC ACID, PLASMA: LACTIC ACID, VENOUS: 1.6 mmol/L (ref 0.5–1.9)

## 2017-12-01 LAB — CBC
HEMATOCRIT: 29.6 % — AB (ref 39.0–52.0)
Hemoglobin: 10 g/dL — ABNORMAL LOW (ref 13.0–17.0)
MCH: 37.9 pg — ABNORMAL HIGH (ref 26.0–34.0)
MCHC: 33.8 g/dL (ref 30.0–36.0)
MCV: 112.1 fL — ABNORMAL HIGH (ref 80.0–100.0)
NRBC: 0 % (ref 0.0–0.2)
PLATELETS: 157 10*3/uL (ref 150–400)
RBC: 2.64 MIL/uL — ABNORMAL LOW (ref 4.22–5.81)
RDW: 14.6 % (ref 11.5–15.5)
WBC: 19.9 10*3/uL — AB (ref 4.0–10.5)

## 2017-12-01 LAB — PROCALCITONIN: Procalcitonin: 14.29 ng/mL

## 2017-12-01 LAB — BASIC METABOLIC PANEL
Anion gap: 7 (ref 5–15)
BUN: 32 mg/dL — AB (ref 8–23)
CHLORIDE: 112 mmol/L — AB (ref 98–111)
CO2: 19 mmol/L — ABNORMAL LOW (ref 22–32)
CREATININE: 1.32 mg/dL — AB (ref 0.61–1.24)
Calcium: 8 mg/dL — ABNORMAL LOW (ref 8.9–10.3)
GFR, EST AFRICAN AMERICAN: 54 mL/min — AB (ref >60)
GFR, EST NON AFRICAN AMERICAN: 46 mL/min — AB (ref >60)
Glucose, Bld: 171 mg/dL — ABNORMAL HIGH (ref 70–99)
Potassium: 4.3 mmol/L (ref 3.5–5.1)
SODIUM: 138 mmol/L (ref 135–145)

## 2017-12-01 LAB — ECHOCARDIOGRAM COMPLETE
HEIGHTINCHES: 70 in
WEIGHTICAEL: 2512 [oz_av]

## 2017-12-01 LAB — GLUCOSE, CAPILLARY
GLUCOSE-CAPILLARY: 160 mg/dL — AB (ref 70–99)
GLUCOSE-CAPILLARY: 151 mg/dL — AB (ref 70–99)
GLUCOSE-CAPILLARY: 152 mg/dL — AB (ref 70–99)
Glucose-Capillary: 166 mg/dL — ABNORMAL HIGH (ref 70–99)

## 2017-12-01 LAB — CK: Total CK: 66 U/L (ref 49–397)

## 2017-12-01 LAB — STREP PNEUMONIAE URINARY ANTIGEN: STREP PNEUMO URINARY ANTIGEN: NEGATIVE

## 2017-12-01 LAB — D-DIMER, QUANTITATIVE (NOT AT ARMC): D DIMER QUANT: 2.98 ug{FEU}/mL — AB (ref 0.00–0.50)

## 2017-12-01 MED ORDER — ENOXAPARIN SODIUM 40 MG/0.4ML ~~LOC~~ SOLN
40.0000 mg | SUBCUTANEOUS | Status: DC
  Administered 2017-12-01 – 2017-12-03 (×3): 40 mg via SUBCUTANEOUS
  Filled 2017-12-01 (×3): qty 0.4

## 2017-12-01 MED ORDER — SODIUM CHLORIDE 0.9 % IV SOLN
INTRAVENOUS | Status: DC
  Administered 2017-12-01: 09:00:00 via INTRAVENOUS

## 2017-12-01 MED ORDER — FUROSEMIDE 10 MG/ML IJ SOLN
20.0000 mg | Freq: Once | INTRAMUSCULAR | Status: AC
  Administered 2017-12-01: 20 mg via INTRAVENOUS
  Filled 2017-12-01: qty 2

## 2017-12-01 MED ORDER — SODIUM CHLORIDE 0.9 % IV SOLN
INTRAVENOUS | Status: DC
  Administered 2017-12-02: 05:00:00 via INTRAVENOUS

## 2017-12-01 MED ORDER — IPRATROPIUM-ALBUTEROL 0.5-2.5 (3) MG/3ML IN SOLN
3.0000 mL | Freq: Four times a day (QID) | RESPIRATORY_TRACT | Status: DC
  Administered 2017-12-01: 3 mL via RESPIRATORY_TRACT
  Filled 2017-12-01: qty 3

## 2017-12-01 NOTE — Plan of Care (Signed)
  Problem: Pain Management: Goal: Pain level will decrease Outcome: Progressing   Problem: Nutrition: Goal: Adequate nutrition will be maintained Outcome: Progressing   Problem: Skin Integrity: Goal: Risk for impaired skin integrity will decrease Outcome: Progressing

## 2017-12-01 NOTE — Progress Notes (Signed)
Patient ID: Terry Harrell, male   DOB: Sep 12, 1927, 84 y.o.   MRN: [id number]  L hip fx awaiting surgery. Surgery cancelled yesterday due to fever. Currently on hold and workup in progress, appreciate hospitalist and ID input. Ortho will continue to follow. Once medically stable for surgery can proceed.

## 2017-12-01 NOTE — Plan of Care (Signed)
  Problem: Education: Goal: Verbalization of understanding the information provided (i.e., activity precautions, restrictions, etc) will improve Outcome: Progressing   Problem: Activity: Goal: Ability to ambulate and perform ADLs will improve Outcome: Progressing   Problem: Clinical Measurements: Goal: Postoperative complications will be avoided or minimized Outcome: Progressing   Problem: Self-Concept: Goal: Ability to maintain and perform role responsibilities to the fullest extent possible will improve Outcome: Progressing   Problem: Pain Management: Goal: Pain level will decrease Outcome: Progressing   Problem: Education: Goal: Knowledge of General Education information will improve Description Including pain rating scale, medication(s)/side effects and non-pharmacologic comfort measures Outcome: Progressing   Problem: Health Behavior/Discharge Planning: Goal: Ability to manage health-related needs will improve Outcome: Progressing   Problem: Clinical Measurements: Goal: Ability to maintain clinical measurements within normal limits will improve Outcome: Progressing Goal: Will remain free from infection Outcome: Progressing Goal: Diagnostic test results will improve Outcome: Progressing Goal: Respiratory complications will improve Outcome: Progressing Goal: Cardiovascular complication will be avoided Outcome: Progressing   Problem: Activity: Goal: Risk for activity intolerance will decrease Outcome: Progressing   Problem: Nutrition: Goal: Adequate nutrition will be maintained Outcome: Progressing   Problem: Elimination: Goal: Will not experience complications related to bowel motility Outcome: Progressing Goal: Will not experience complications related to urinary retention Outcome: Progressing   Problem: Pain Managment: Goal: General experience of comfort will improve Outcome: Progressing   Problem: Safety: Goal: Ability to remain free from injury will  improve Outcome: Progressing   Problem: Skin Integrity: Goal: Risk for impaired skin integrity will decrease Outcome: Progressing

## 2017-12-01 NOTE — Progress Notes (Signed)
  Echocardiogram 2D Echocardiogram has been performed.  Zelta Enfield G Karter Hellmer 12/01/2017, 1:42 PM

## 2017-12-01 NOTE — Progress Notes (Signed)
PROGRESS NOTE  Terry Harrell ZWC:[id number] DOB: [birth year] DOA: 11/29/2017 PCP: Drake Leach, MD  HPI/Recap of past 24 hours: Terry Harrell is a 84 y.o. male with medical history significant for acoustic neuroma, trigeminal neuralgia, diabetes, GERD presents to the ED complaining of left hip pain after mechanical fall at home.  History provided by wife as patient is extremely hard of hearing.  Wife stated she normally assists patient during ambulation, but while she was in the bathroom, patient decided to ambulate unassisted, resulting to him falling on his side, denies hitting his head or losing consciousness.  Patient was okay ambulating for about an hour after the fall, later complained of significant left hip pain and was unable to ambulate, hence bringing him to the ED.  Patient denies any chest pain, shortness of breath, palpitations, dizziness, abdominal pain, nausea/vomiting, diarrhea, cough, fever/chills. Wife stated patient is very prone to falling if he gets up unassisted. In the ED, CT left hip showed acute subcapital left femoral neck fracture.  Orthopedics consulted.  Patient admitted for further management.  Yesterday, pt had a fever of about 102, with some leukocytosis. Noted to be somewhat lethargic. Today pt denies any new complaints, no fever noted overnight, denies any chest pain, abdominal pain, N/V, cough, dysuria.  Assessment/Plan: Principal Problem:   Closed left hip fracture, initial encounter [facility]) Active Problems:   Fall   GERD (gastroesophageal reflux disease)   Diabetes mellitus without complication (HCC)   Trigeminal neuralgia   BPH (benign prostatic hyperplasia)  Sepsis likely 2/2 CAP Fever, leukocytosis, lactic acidosis on 11/30/17 Currently afebrile, with worsening leukocytosis Lactic acidosis 2.1-->3.9-->1.6 s/p IVF Procalcitonin 14.29, will trend CXR with cardiomegaly with pulmonary consolidation at the left lung base suspicious for atelectasis and/or  pneumonia. Mild vascular congestion UA with large hgb, trace leukocytes, few bacteria UC pending, BC X 2 pending Continue IV ceftriaxone, azithromycin Continue IVF Monitor closely  AKI Creatinine 1.32, baseline normal Continue IV fluids Avoid nephrotoxic's Daily BMP  Macrocytic anemia In addition to ?dilutional from IV fluids Anemia panel Daily CBC  Acute subcapital left femoral neck fracture S/P mechanical fall CT left hip showed above Orthopedics on board, plan for percutaneous cannulated screw fixation once stable DVT PPx, pain management, PT/OT per Ortho  Diabetes mellitus type 2 with neuropathy SSI, Accu-Cheks Continue home gabapentin Hold home metformin  GERD Continue PPI  Trigeminal neuralgia Continue home carbamazepine  BPH Continue Flomax   Code Status: Partial code  Family Communication: None at bedside  Disposition Plan: To be determined   Consultants:  Orthopedics  ID  Procedures:  None  Antimicrobials:  IV ceftriaxone  Azithromycin  DVT prophylaxis: Lovenox   Objective: Vitals:   11/30/17 1645 11/30/17 1856 11/30/17 2053 12/01/17 1157  BP:  104/63 101/60 104/65  Pulse:  67 71 70  Resp:  16 14 18   Temp: 98.2 F (36.8 C) 98 F (36.7 C) 98.1 F (36.7 C) 99.4 F (37.4 C)  TempSrc: Oral Oral Oral Oral  SpO2:  94% 95% 93%  Weight:      Height:        Intake/Output Summary (Last 24 hours) at 12/01/2017 1437 Last data filed at 12/01/2017 0300 Gross per 24 hour  Intake 1870.81 ml  Output 600 ml  Net 1270.81 ml   Filed Weights   11/29/17 1532  Weight: 71.2 kg    Exam:   General: NAD  Cardiovascular: S1, S2 present  Respiratory: CTA B  Abdomen: Soft, nontender, nondistended, bowel sounds present  Musculoskeletal: Chronic bilateral lymphedema noted  Skin: Normal  Psychiatry: Normal mood   Data Reviewed: CBC: Recent Labs  Lab 11/29/17 1839 12/01/17 0444  WBC 11.3* 19.9*  NEUTROABS 9.2*  --     HGB 12.2* 10.0*  HCT 36.6* 29.6*  MCV 112.3* 112.1*  PLT 209 440   Basic Metabolic Panel: Recent Labs  Lab 11/29/17 1839 12/01/17 0444  NA 136 138  K 4.3 4.3  CL 107 112*  CO2 20* 19*  GLUCOSE 118* 171*  BUN 25* 32*  CREATININE 0.81 1.32*  CALCIUM 8.9 8.0*   GFR: Estimated Creatinine Clearance: 39 mL/min (A) (by C-G formula based on SCr of 1.32 mg/dL (H)). Liver Function Tests: Recent Labs  Lab 12/01/17 0829  AST 27  ALT 42  ALKPHOS 91  BILITOT 1.1  PROT 5.6*  ALBUMIN 2.6*   No results for input(s): LIPASE, AMYLASE in the last 168 hours. No results for input(s): AMMONIA in the last 168 hours. Coagulation Profile: Recent Labs  Lab 11/29/17 1839  INR 1.07   Cardiac Enzymes: Recent Labs  Lab 12/01/17 0829  CKTOTAL 66   BNP (last 3 results) No results for input(s): PROBNP in the last 8760 hours. HbA1C: No results for input(s): HGBA1C in the last 72 hours. CBG: Recent Labs  Lab 11/30/17 1152 11/30/17 1613 11/30/17 2156 12/01/17 0658 12/01/17 1157  GLUCAP 129* 205* 168* 166* 160*   Lipid Profile: No results for input(s): CHOL, HDL, LDLCALC, TRIG, CHOLHDL, LDLDIRECT in the last 72 hours. Thyroid Function Tests: No results for input(s): TSH, T4TOTAL, FREET4, T3FREE, THYROIDAB in the last 72 hours. Anemia Panel: No results for input(s): VITAMINB12, FOLATE, FERRITIN, TIBC, IRON, RETICCTPCT in the last 72 hours. Urine analysis:    Component Value Date/Time   COLORURINE AMBER (A) 11/30/2017 1357   APPEARANCEUR CLOUDY (A) 11/30/2017 1357   LABSPEC 1.013 11/30/2017 1357   PHURINE 7.0 11/30/2017 1357   GLUCOSEU NEGATIVE 11/30/2017 1357   HGBUR LARGE (A) 11/30/2017 1357   BILIRUBINUR NEGATIVE 11/30/2017 1357   Jolly 11/30/2017 1357   PROTEINUR 30 (A) 11/30/2017 1357   NITRITE NEGATIVE 11/30/2017 1357   LEUKOCYTESUR TRACE (A) 11/30/2017 1357   Sepsis Labs: @LABRCNTIP (procalcitonin:4,lacticidven:4)  ) Recent Results (from the past 240  hour(s))  MRSA PCR Screening     Status: None   Collection Time: 11/29/17 11:55 PM  Result Value Ref Range Status   MRSA by PCR NEGATIVE NEGATIVE Final    Comment:        The GeneXpert MRSA Assay (FDA approved for NASAL specimens only), is one component of a comprehensive MRSA colonization surveillance program. It is not intended to diagnose MRSA infection nor to guide or monitor treatment for MRSA infections. Performed at [facility], [place] [address], [place], [place] [address]   Culture, blood (routine x 2)     Status: None (Preliminary result)   Collection Time: 11/30/17  2:14 PM  Result Value Ref Range Status   Specimen Description BLOOD BLOOD RIGHT FOREARM  Final   Special Requests   Final    BOTTLES DRAWN AEROBIC AND ANAEROBIC Blood Culture results may not be optimal due to an excessive volume of blood received in culture bottles   Culture   Final    NO GROWTH < 24 HOURS Performed at [facility], [place] [address], [place], [place] [address]    Report Status PENDING  Incomplete  Culture, blood (routine x 2)     Status: None (Preliminary result)  Collection Time: 11/30/17  2:19 PM  Result Value Ref Range Status   Specimen Description BLOOD BLOOD LEFT ARM  Final   Special Requests   Final    BOTTLES DRAWN AEROBIC AND ANAEROBIC Blood Culture adequate volume   Culture   Final    NO GROWTH < 24 HOURS Performed at [facility], [place] [address] [place], [place] [address]    Report Status PENDING  Incomplete      Studies: No results found.  Scheduled Meds: . carbamazepine  100 mg Oral QHS  . chlorhexidine  60 mL Topical Once  . gabapentin  300 mg Oral QHS  . insulin aspart  0-9 Units Subcutaneous TID WC  . Melatonin  3 mg Oral QHS  . pantoprazole  40 mg Oral Daily  . senna  1 tablet Oral BID  . tamsulosin  0.4 mg Oral Daily    Continuous Infusions: . sodium chloride 75 mL/hr at 12/01/17 0839  . azithromycin 500 mg (11/30/17 1645)  .  cefTRIAXone (ROCEPHIN)  IV 1 g (11/30/17 1645)     LOS: 2 days     Alma Friendly, MD [facility]  If 7PM-7AM, please contact night-coverage [email or web address] 12/01/2017, 2:37 PM

## 2017-12-01 NOTE — Plan of Care (Signed)
  Problem: Pain Management: Goal: Pain level will decrease Outcome: Progressing   Problem: Nutrition: Goal: Adequate nutrition will be maintained Outcome: Progressing   Problem: Safety: Goal: Ability to remain free from injury will improve Outcome: Progressing   Problem: Skin Integrity: Goal: Risk for impaired skin integrity will decrease Outcome: Progressing

## 2017-12-02 ENCOUNTER — Inpatient Hospital Stay (HOSPITAL_COMMUNITY): Payer: Medicare Other

## 2017-12-02 LAB — CBC WITH DIFFERENTIAL/PLATELET
ABS IMMATURE GRANULOCYTES: 0.1 10*3/uL — AB (ref 0.00–0.07)
BASOS ABS: 0 10*3/uL (ref 0.0–0.1)
Basophils Relative: 0 %
EOS PCT: 0 %
Eosinophils Absolute: 0 10*3/uL (ref 0.0–0.5)
HCT: 29.2 % — ABNORMAL LOW (ref 39.0–52.0)
HEMOGLOBIN: 9.7 g/dL — AB (ref 13.0–17.0)
Immature Granulocytes: 1 %
LYMPHS ABS: 0.8 10*3/uL (ref 0.7–4.0)
LYMPHS PCT: 7 %
MCH: 37.7 pg — ABNORMAL HIGH (ref 26.0–34.0)
MCHC: 33.2 g/dL (ref 30.0–36.0)
MCV: 113.6 fL — AB (ref 80.0–100.0)
MONO ABS: 0.6 10*3/uL (ref 0.1–1.0)
Monocytes Relative: 5 %
NEUTROS ABS: 10.5 10*3/uL — AB (ref 1.7–7.7)
Neutrophils Relative %: 87 %
Platelets: 142 10*3/uL — ABNORMAL LOW (ref 150–400)
RBC: 2.57 MIL/uL — ABNORMAL LOW (ref 4.22–5.81)
RDW: 14.7 % (ref 11.5–15.5)
WBC: 12 10*3/uL — ABNORMAL HIGH (ref 4.0–10.5)
nRBC: 0 % (ref 0.0–0.2)

## 2017-12-02 LAB — BLOOD CULTURE ID PANEL (REFLEXED)
ACINETOBACTER BAUMANNII: NOT DETECTED
CANDIDA KRUSEI: NOT DETECTED
CANDIDA PARAPSILOSIS: NOT DETECTED
CANDIDA TROPICALIS: NOT DETECTED
Candida albicans: NOT DETECTED
Candida glabrata: NOT DETECTED
ESCHERICHIA COLI: NOT DETECTED
Enterobacter cloacae complex: NOT DETECTED
Enterobacteriaceae species: NOT DETECTED
Enterococcus species: NOT DETECTED
Haemophilus influenzae: NOT DETECTED
KLEBSIELLA OXYTOCA: NOT DETECTED
KLEBSIELLA PNEUMONIAE: NOT DETECTED
Listeria monocytogenes: NOT DETECTED
METHICILLIN RESISTANCE: NOT DETECTED
Neisseria meningitidis: NOT DETECTED
PSEUDOMONAS AERUGINOSA: NOT DETECTED
Proteus species: NOT DETECTED
SERRATIA MARCESCENS: NOT DETECTED
STAPHYLOCOCCUS AUREUS BCID: NOT DETECTED
STAPHYLOCOCCUS SPECIES: DETECTED — AB
STREPTOCOCCUS PNEUMONIAE: NOT DETECTED
Streptococcus agalactiae: NOT DETECTED
Streptococcus pyogenes: NOT DETECTED
Streptococcus species: NOT DETECTED

## 2017-12-02 LAB — BASIC METABOLIC PANEL
Anion gap: 7 (ref 5–15)
BUN: 38 mg/dL — AB (ref 8–23)
CO2: 21 mmol/L — AB (ref 22–32)
CREATININE: 1.34 mg/dL — AB (ref 0.61–1.24)
Calcium: 7.8 mg/dL — ABNORMAL LOW (ref 8.9–10.3)
Chloride: 112 mmol/L — ABNORMAL HIGH (ref 98–111)
GFR calc non Af Amer: 46 mL/min — ABNORMAL LOW (ref >60)
GFR, EST AFRICAN AMERICAN: 53 mL/min — AB (ref >60)
Glucose, Bld: 169 mg/dL — ABNORMAL HIGH (ref 70–99)
POTASSIUM: 3.8 mmol/L (ref 3.5–5.1)
SODIUM: 140 mmol/L (ref 135–145)

## 2017-12-02 LAB — GLUCOSE, CAPILLARY
GLUCOSE-CAPILLARY: 103 mg/dL — AB (ref 70–99)
Glucose-Capillary: 144 mg/dL — ABNORMAL HIGH (ref 70–99)
Glucose-Capillary: 177 mg/dL — ABNORMAL HIGH (ref 70–99)
Glucose-Capillary: 227 mg/dL — ABNORMAL HIGH (ref 70–99)

## 2017-12-02 LAB — IRON AND TIBC
IRON: 11 ug/dL — AB (ref 45–182)
SATURATION RATIOS: 8 % — AB (ref 17.9–39.5)
TIBC: 132 ug/dL — AB (ref 250–450)
UIBC: 121 ug/dL

## 2017-12-02 LAB — FERRITIN: Ferritin: 304 ng/mL (ref 24–336)

## 2017-12-02 LAB — LEGIONELLA PNEUMOPHILA SEROGP 1 UR AG: L. PNEUMOPHILA SEROGP 1 UR AG: NEGATIVE

## 2017-12-02 LAB — PROCALCITONIN: Procalcitonin: 13.06 ng/mL

## 2017-12-02 LAB — VITAMIN B12: VITAMIN B 12: 344 pg/mL (ref 180–914)

## 2017-12-02 LAB — FOLATE: Folate: 8.2 ng/mL (ref >5.9)

## 2017-12-02 MED ORDER — SODIUM CHLORIDE 0.9 % IV SOLN
510.0000 mg | Freq: Once | INTRAVENOUS | Status: AC
  Administered 2017-12-02: 510 mg via INTRAVENOUS
  Filled 2017-12-02: qty 17

## 2017-12-02 MED ORDER — TECHNETIUM TC 99M DIETHYLENETRIAME-PENTAACETIC ACID
30.8000 | Freq: Once | INTRAVENOUS | Status: AC | PRN
  Administered 2017-12-02: 30.8 via RESPIRATORY_TRACT

## 2017-12-02 MED ORDER — VITAMIN B-12 1000 MCG PO TABS
1000.0000 ug | ORAL_TABLET | Freq: Every day | ORAL | Status: DC
  Administered 2017-12-02 – 2017-12-07 (×5): 1000 ug via ORAL
  Filled 2017-12-02 (×5): qty 1

## 2017-12-02 MED ORDER — IPRATROPIUM-ALBUTEROL 0.5-2.5 (3) MG/3ML IN SOLN
3.0000 mL | Freq: Three times a day (TID) | RESPIRATORY_TRACT | Status: DC
  Administered 2017-12-02 – 2017-12-03 (×4): 3 mL via RESPIRATORY_TRACT
  Filled 2017-12-02 (×5): qty 3

## 2017-12-02 MED ORDER — TECHNETIUM TO 99M ALBUMIN AGGREGATED
4.2800 | Freq: Once | INTRAVENOUS | Status: AC | PRN
  Administered 2017-12-02: 4.28 via INTRAVENOUS

## 2017-12-02 NOTE — Evaluation (Signed)
Clinical/Bedside Swallow Evaluation Patient Details  Name: Terry Harrell MRN: [id number] Date of Birth: [birth year]  Today's Date: 12/02/2017 Time: SLP Start Time (ACUTE ONLY): 1545 SLP Stop Time (ACUTE ONLY): 1605 SLP Time Calculation (min) (ACUTE ONLY): 20 min  Past Medical History:  Past Medical History:  Diagnosis Date  . Acoustic neuroma ([place])   . Barretts esophagus   . Depression   . Diabetes mellitus without complication ([place])   . Edema   . GERD (gastroesophageal reflux disease)   . Kidney infection   . Neuropathy   . Spinal stenosis   . Trigeminal neuralgia    Past Surgical History:  Past Surgical History:  Procedure Laterality Date  . APPENDECTOMY    . BRAIN SURGERY    . HERNIA REPAIR     HPI:  84 y.o. male with medical history significant for acoustic neuroma, trigeminal neuralgia, diabetes, GERD, extremely HOH, presents to the ED complaining of left hip pain after mechanical fall at home. Pt sustained left femoral neck fx.  Dx includes sepsis, CAP, AKI. Plan is for percutaneous screw fixation on 10/22.    Assessment / Plan / Recommendation Clinical Impression  Pt presents with functional oropharyngeal swallow with adequate mastication despite absence of dentures, brisk swallow response, no overt s/s of aspiration.  Pt c/o "knot" in throat - reviewed MBS from [place] last year, which identified a Zenker's diverticulum.  We discussed the tendency for solid food residue to lodge in Zenker's; his wife confirms that he avoids meats due to this problem.  Recommend continuing current diet so that food choices are optimized.  No further SLP f/u is warranted.  SLP Visit Diagnosis: Dysphagia, unspecified (R13.10)    Aspiration Risk  No limitations    Diet Recommendation   regular solids, thin liquids  Medication Administration: Whole meds with liquid    Other  Recommendations Oral Care Recommendations: Oral care BID   Follow up Recommendations None      Frequency and  Duration            Prognosis        Swallow Study   General Date of Onset: 12/01/17   Type of Study: Bedside Swallow Evaluation  Previous Swallow Assessment: MBS 05/01/16 at [facility]: Modified barium swallow study completed. "Patient presents with moderate oral dysphagia and mild pharyngeal dysphagia characterized by delayed bolus transit with puree material, slow mastication with solids, moderate oral residue with thin liquids and purees, premature loss of the bolus with thin liquids and solids, swallow trigger in the valleculae with purees and solids, swallow trigger in the pyriform sinuses with thin liquids, and a Zenkers diverticulum that did not appear to cause any functional issues. Patient's oral residue frequently falls to the valleculae and a second swallow is not triggered. Patient silently penetrated thin liquids prior to the swallow when using a straw. Esophageal sweep didn't reveal any overt issues, despite patient's history of Barrett's esophagus. ST recommends a regular diet with thin liquids, no straws and one sip at a time. Patient and family educated. ST to follow for therapeutic diet tolerance and least restrictive diet. ST also recommends a f/u at the next level of care for patient's increasingly unintelligible speech, soft palate function was assessed during this study and no obvious issues were seen. Patient's wife and daughter state that patient's speech has always been hard to understand due to his hearing, but that it has recently gotten much worse."     Diet Prior to this Study: Regular;Thin  liquids Temperature Spikes Noted: No Respiratory Status: Nasal cannula History of Recent Intubation: No Behavior/Cognition: Alert Oral Cavity Assessment: Within Functional Limits Oral Care Completed by SLP: No Oral Cavity - Dentition: Edentulous Vision: Functional for self-feeding Self-Feeding Abilities: Able to feed self Patient Positioning: Upright in bed Baseline Vocal  Quality: Normal Volitional Cough: Strong Volitional Swallow: Able to elicit    Oral/Motor/Sensory Function Overall Oral Motor/Sensory Function: Within functional limits   Ice Chips Ice chips: Within functional limits Presentation: Spoon   Thin Liquid Thin Liquid: Within functional limits Presentation: Cup;Straw    Nectar Thick Nectar Thick Liquid: Not tested   Honey Thick Honey Thick Liquid: Not tested   Puree Puree: Within functional limits   Solid     Solid: Within functional limits      Juan Quam Laurice 12/02/2017,4:22 PM  Estill Bamberg L. Tivis Ringer, [place] Office number [phone] Pager [phone]

## 2017-12-02 NOTE — Progress Notes (Signed)
PROGRESS NOTE  Terry Harrell OYD:[id number] DOB: 1927-12-16 DOA: 11/29/2017 PCP: Drake Leach, MD  HPI/Recap of past 24 hours: Terry Harrell is a 84 y.o. male with medical history significant for acoustic neuroma, trigeminal neuralgia, diabetes, GERD presents to the ED complaining of left hip pain after mechanical fall at home.  History provided by wife as patient is extremely hard of hearing.  Wife stated she normally assists patient during ambulation, but while she was in the bathroom, patient decided to ambulate unassisted, resulting to him falling on his side, denies hitting his head or losing consciousness.  Patient was okay ambulating for about an hour after the fall, later complained of significant left hip pain and was unable to ambulate, hence bringing him to the ED.  Patient denies any chest pain, shortness of breath, palpitations, dizziness, abdominal pain, nausea/vomiting, diarrhea, cough, fever/chills. Wife stated patient is very prone to falling if he gets up unassisted. In the ED, CT left hip showed acute subcapital left femoral neck fracture.  Orthopedics consulted.  Patient admitted for further management.  Today, pt reported feeling better, able to eat lunch well, no SOB noted, denies any chest pain, worsening cough, abdominal pain, N/V. Had a low grade temp 100.3 yesterday evening.  Assessment/Plan: Principal Problem:   Closed left hip fracture, initial encounter [facility]) Active Problems:   Fall   GERD (gastroesophageal reflux disease)   Diabetes mellitus without complication (HCC)   Trigeminal neuralgia   BPH (benign prostatic hyperplasia)  Sepsis likely 2/2 CAP Fever, leukocytosis, lactic acidosis on 11/30/17 Currently afebrile, with improving leukocytosis Lactic acidosis 2.1-->3.9-->1.6 s/p IVF Procalcitonin 14.29-->13.06 UA with large hgb, trace leukocytes, few bacteria UC with multiple species BC with 1/4 bottle growing staph, likely contaminant, will repeat BC CXR  with cardiomegaly with pulmonary consolidation at the left lung base suspicious for atelectasis and/or pneumonia. Mild vascular congestion Continue IV ceftriaxone, azithromycin Continue gentle hydration Monitor closely  AKI Creatinine 1.32, last in 04/2017 was normal Continue IV fluids Renal USS pending Avoid nephrotoxic's Daily BMP  Elevated D-dimer Likely 2/2 sepsis V/Q scan pending  Macrocytic anemia Hemoglobin baseline around 12, currently 9.7 In addition to ?dilutional from IV fluids Anemia panel showed iron 11, sats 8, folate 8.2, Vitamin B12 344 S/P IV feraheme on 10/20, started on Vitamin B12 Daily CBC  Acute subcapital left femoral neck fracture S/P mechanical fall CT left hip showed above Orthopedics on board, plan for percutaneous cannulated screw fixation once stable DVT PPx, pain management, PT/OT per Ortho  Diabetes mellitus type 2 with neuropathy SSI, Accu-Cheks Continue home gabapentin Hold home metformin  GERD Continue PPI  Trigeminal neuralgia Continue home carbamazepine  BPH Continue Flomax   Code Status: Partial code  Family Communication: Wife at bedside  Disposition Plan: To be determined   Consultants:  Orthopedics  ID  Procedures:  None  Antimicrobials:  IV ceftriaxone  Azithromycin  DVT prophylaxis: Lovenox   Objective: Vitals:   12/02/17 0507 12/02/17 1125 12/02/17 1458 12/02/17 1504  BP: 106/70 (!) 109/59 95/60   Pulse: (!) 53 (!) 47 60   Resp: 18     Temp: 97.6 F (36.4 C) 98.2 F (36.8 C) (!) 97.5 F (36.4 C)   TempSrc: Oral Oral Oral   SpO2: 93% 99% 91% 91%  Weight:      Height:        Intake/Output Summary (Last 24 hours) at 12/02/2017 1508 Last data filed at 12/02/2017 0500 Gross per 24 hour  Intake 870 ml  Output  900 ml  Net -30 ml   Filed Weights   11/29/17 1532  Weight: 71.2 kg    Exam:   General: NAD  Cardiovascular: S1, S2 present  Respiratory: Diminished BS  bilaterally  Abdomen: Soft, NT, ND, BS present  Musculoskeletal: Chronic bilateral lymphedema  Skin: Normal  Psychiatry: Normal mood   Data Reviewed: CBC: Recent Labs  Lab 11/29/17 1839 12/01/17 0444 12/02/17 0325  WBC 11.3* 19.9* 12.0*  NEUTROABS 9.2*  --  10.5*  HGB 12.2* 10.0* 9.7*  HCT 36.6* 29.6* 29.2*  MCV 112.3* 112.1* 113.6*  PLT 209 157 989*   Basic Metabolic Panel: Recent Labs  Lab 11/29/17 1839 12/01/17 0444 12/02/17 0325  NA 136 138 140  K 4.3 4.3 3.8  CL 107 112* 112*  CO2 20* 19* 21*  GLUCOSE 118* 171* 169*  BUN 25* 32* 38*  CREATININE 0.81 1.32* 1.34*  CALCIUM 8.9 8.0* 7.8*   GFR: Estimated Creatinine Clearance: 38.4 mL/min (A) (by C-G formula based on SCr of 1.34 mg/dL (H)). Liver Function Tests: Recent Labs  Lab 12/01/17 0829  AST 27  ALT 42  ALKPHOS 91  BILITOT 1.1  PROT 5.6*  ALBUMIN 2.6*   No results for input(s): LIPASE, AMYLASE in the last 168 hours. No results for input(s): AMMONIA in the last 168 hours. Coagulation Profile: Recent Labs  Lab 11/29/17 1839  INR 1.07   Cardiac Enzymes: Recent Labs  Lab 12/01/17 0829  CKTOTAL 66   BNP (last 3 results) No results for input(s): PROBNP in the last 8760 hours. HbA1C: No results for input(s): HGBA1C in the last 72 hours. CBG: Recent Labs  Lab 12/01/17 1157 12/01/17 1618 12/01/17 2140 12/02/17 0700 12/02/17 1142  GLUCAP 160* 151* 152* 144* 177*   Lipid Profile: No results for input(s): CHOL, HDL, LDLCALC, TRIG, CHOLHDL, LDLDIRECT in the last 72 hours. Thyroid Function Tests: No results for input(s): TSH, T4TOTAL, FREET4, T3FREE, THYROIDAB in the last 72 hours. Anemia Panel: Recent Labs    12/02/17 0325  VITAMINB12 344  FOLATE 8.2  FERRITIN 304  TIBC 132*  IRON 11*   Urine analysis:    Component Value Date/Time   COLORURINE AMBER (A) 11/30/2017 1357   APPEARANCEUR CLOUDY (A) 11/30/2017 1357   LABSPEC 1.013 11/30/2017 1357   PHURINE 7.0 11/30/2017 1357    GLUCOSEU NEGATIVE 11/30/2017 1357   HGBUR LARGE (A) 11/30/2017 1357   BILIRUBINUR NEGATIVE 11/30/2017 1357   [place] 11/30/2017 1357   PROTEINUR 30 (A) 11/30/2017 1357   NITRITE NEGATIVE 11/30/2017 1357   LEUKOCYTESUR TRACE (A) 11/30/2017 1357   Sepsis Labs: @LABRCNTIP (procalcitonin:4,lacticidven:4)  ) Recent Results (from the past 240 hour(s))  MRSA PCR Screening     Status: None   Collection Time: 11/29/17 11:55 PM  Result Value Ref Range Status   MRSA by PCR NEGATIVE NEGATIVE Final    Comment:        The GeneXpert MRSA Assay (FDA approved for NASAL specimens only), is one component of a comprehensive MRSA colonization surveillance program. It is not intended to diagnose MRSA infection nor to guide or monitor treatment for MRSA infections. Performed at [facility], [place] [address], [place],  [address]   Urine Culture     Status: Abnormal   Collection Time: 11/30/17  1:57 PM  Result Value Ref Range Status   Specimen Description URINE, CATHETERIZED  Final   Special Requests   Final    NONE Performed at [facility], [place]  [address], [place], [place] [address]    Culture MULTIPLE SPECIES PRESENT, SUGGEST RECOLLECTION (A)  Final   Report Status 12/01/2017 FINAL  Final  Culture, blood (routine x 2)     Status: None (Preliminary result)   Collection Time: 11/30/17  2:14 PM  Result Value Ref Range Status   Specimen Description BLOOD BLOOD RIGHT FOREARM  Final   Special Requests   Final    BOTTLES DRAWN AEROBIC AND ANAEROBIC Blood Culture results may not be optimal due to an excessive volume of blood received in culture bottles   Culture  Setup Time   Final    GRAM POSITIVE COCCI IN CLUSTERS IN BOTH AEROBIC AND ANAEROBIC BOTTLES CRITICAL RESULT CALLED TO, READ BACK BY AND VERIFIED WITH: L.SEAY,PHARMD AT 5102 ON 12/02/17 BY G.[email or web address] Performed at [facility], [place] [address], [place], [place] [address]    Culture GRAM POSITIVE COCCI  Final    Report Status PENDING  Incomplete  Blood Culture ID Panel (Reflexed)     Status: Abnormal   Collection Time: 11/30/17  2:14 PM  Result Value Ref Range Status   Enterococcus species NOT DETECTED NOT DETECTED Final   Listeria monocytogenes NOT DETECTED NOT DETECTED Final   Staphylococcus species DETECTED (A) NOT DETECTED Final    Comment: Methicillin (oxacillin) susceptible coagulase negative staphylococcus. Possible blood culture contaminant (unless isolated from more than one blood culture draw or clinical case suggests pathogenicity). No antibiotic treatment is indicated for blood  culture contaminants. CRITICAL RESULT CALLED TO, READ BACK BY AND VERIFIED WITH: L.SEAY, PHARMD AT 0418 ON 12/02/17 BY [email or web address]    Staphylococcus aureus (BCID) NOT DETECTED NOT DETECTED Final   Methicillin resistance NOT DETECTED NOT DETECTED Final   Streptococcus species NOT DETECTED NOT DETECTED Final   Streptococcus agalactiae NOT DETECTED NOT DETECTED Final   Streptococcus pneumoniae NOT DETECTED NOT DETECTED Final   Streptococcus pyogenes NOT DETECTED NOT DETECTED Final   Acinetobacter baumannii NOT DETECTED NOT DETECTED Final   Enterobacteriaceae species NOT DETECTED NOT DETECTED Final   Enterobacter cloacae complex NOT DETECTED NOT DETECTED Final   Escherichia coli NOT DETECTED NOT DETECTED Final   Klebsiella oxytoca NOT DETECTED NOT DETECTED Final   Klebsiella pneumoniae NOT DETECTED NOT DETECTED Final   Proteus species NOT DETECTED NOT DETECTED Final   Serratia marcescens NOT DETECTED NOT DETECTED Final   Haemophilus influenzae NOT DETECTED NOT DETECTED Final   Neisseria meningitidis NOT DETECTED NOT DETECTED Final   Pseudomonas aeruginosa NOT DETECTED NOT DETECTED Final   Candida albicans NOT DETECTED NOT DETECTED Final   Candida glabrata NOT DETECTED NOT DETECTED Final   Candida krusei NOT DETECTED NOT DETECTED Final   Candida parapsilosis NOT DETECTED NOT DETECTED Final   Candida tropicalis  NOT DETECTED NOT DETECTED Final    Comment: Performed at [facility], [place]. [address] [place], [place] [address]  Culture, blood (routine x 2)     Status: None (Preliminary result)   Collection Time: 11/30/17  2:19 PM  Result Value Ref Range Status   Specimen Description BLOOD BLOOD LEFT ARM  Final   Special Requests   Final    BOTTLES DRAWN AEROBIC AND ANAEROBIC Blood Culture adequate volume   Culture  Setup Time   Final    GRAM POSITIVE COCCI IN CLUSTERS ANAEROBIC BOTTLE ONLY CRITICAL VALUE NOTED.  VALUE IS CONSISTENT WITH PREVIOUSLY REPORTED AND CALLED VALUE. Performed at [facility], [place] [address], [place], [place] [address]    Culture  GRAM POSITIVE COCCI  Final   Report Status PENDING  Incomplete      Studies: No results found.  Scheduled Meds: . carbamazepine  100 mg Oral QHS  . chlorhexidine  60 mL Topical Once  . enoxaparin (LOVENOX) injection  40 mg Subcutaneous Q24H  . gabapentin  300 mg Oral QHS  . insulin aspart  0-9 Units Subcutaneous TID WC  . ipratropium-albuterol  3 mL Nebulization TID  . Melatonin  3 mg Oral QHS  . pantoprazole  40 mg Oral Daily  . senna  1 tablet Oral BID  . tamsulosin  0.4 mg Oral Daily    Continuous Infusions: . sodium chloride 50 mL/hr at 12/02/17 0518  . azithromycin 500 mg (12/02/17 1456)  . cefTRIAXone (ROCEPHIN)  IV 1 g (12/02/17 1456)  . ferumoxytol       LOS: 3 days     Alma Friendly, MD [facility]  If 7PM-7AM, please contact night-coverage [email or web address] 12/02/2017, 3:08 PM

## 2017-12-02 NOTE — Progress Notes (Signed)
SLP Cancellation Note  Patient Details Name: Griselda Tosh MRN: [id number] DOB: 11/08/1927   Cancelled treatment:       Reason Eval/Treat Not Completed: Patient at procedure or test/unavailable.  Pt out of room.  Will continue efforts.   Juan Quam Laurice 12/02/2017, 10:56 AM

## 2017-12-02 NOTE — Progress Notes (Signed)
Patient ID: Terry Harrell, male   DOB: 1927-07-18, 84 y.o.   MRN: [id number]  Comfortable this am No events  Able to move left LE a little without significant pain  Dx: Left non to minimally displaced femoral neck fracture Pneumonia  Plan: Once ok'd by medicine to proceed then he is to have planned percutaneous screw fixation of left hip He mentioned Tuesday to me.  Given that no report of clearance as of now I will not make him NPO Surgery pending - Dr. Veverly Fells primarily following Orthopaedically

## 2017-12-02 NOTE — Progress Notes (Signed)
Pt had a new onset of Afib. MD paged. ECG per MD order was performed. Per CCMD "it was not Afib."   Pt's HR  53-70. Pt is asymptomatic. No pain, resting comfortably. Nursing will continue to monitor.

## 2017-12-02 NOTE — Progress Notes (Signed)
PHARMACY - PHYSICIAN COMMUNICATION CRITICAL VALUE ALERT - BLOOD CULTURE IDENTIFICATION (BCID)  Terry Harrell is an 84 y.o. male who presented to [place] on 11/29/2017 with a chief complaint of fever  Assessment:  1/4 BC positive for Staph species, likely contaminant  Name of physician (or Provider) Contacted: Brandon Melnick  Current antibiotics: azithromycin and rocephin  Changes to prescribed antibiotics recommended:  none  Results for orders placed or performed during the hospital encounter of 11/29/17  Blood Culture ID Panel (Reflexed) (Collected: 11/30/2017  2:14 PM)  Result Value Ref Range   Enterococcus species NOT DETECTED NOT DETECTED   Listeria monocytogenes NOT DETECTED NOT DETECTED   Staphylococcus species DETECTED (A) NOT DETECTED   Staphylococcus aureus (BCID) NOT DETECTED NOT DETECTED   Methicillin resistance NOT DETECTED NOT DETECTED   Streptococcus species NOT DETECTED NOT DETECTED   Streptococcus agalactiae NOT DETECTED NOT DETECTED   Streptococcus pneumoniae NOT DETECTED NOT DETECTED   Streptococcus pyogenes NOT DETECTED NOT DETECTED   Acinetobacter baumannii NOT DETECTED NOT DETECTED   Enterobacteriaceae species NOT DETECTED NOT DETECTED   Enterobacter cloacae complex NOT DETECTED NOT DETECTED   Escherichia coli NOT DETECTED NOT DETECTED   Klebsiella oxytoca NOT DETECTED NOT DETECTED   Klebsiella pneumoniae NOT DETECTED NOT DETECTED   Proteus species NOT DETECTED NOT DETECTED   Serratia marcescens NOT DETECTED NOT DETECTED   Haemophilus influenzae NOT DETECTED NOT DETECTED   Neisseria meningitidis NOT DETECTED NOT DETECTED   Pseudomonas aeruginosa NOT DETECTED NOT DETECTED   Candida albicans NOT DETECTED NOT DETECTED   Candida glabrata NOT DETECTED NOT DETECTED   Candida krusei NOT DETECTED NOT DETECTED   Candida parapsilosis NOT DETECTED NOT DETECTED   Candida tropicalis NOT DETECTED NOT DETECTED    Excell Seltzer Poteet 12/02/2017  4:24 AM

## 2017-12-03 DIAGNOSIS — B957 Other staphylococcus as the cause of diseases classified elsewhere: Secondary | ICD-10-CM

## 2017-12-03 DIAGNOSIS — R7881 Bacteremia: Secondary | ICD-10-CM

## 2017-12-03 LAB — CBC WITH DIFFERENTIAL/PLATELET
Abs Immature Granulocytes: 0.1 10*3/uL — ABNORMAL HIGH (ref 0.00–0.07)
BASOS PCT: 1 %
Basophils Absolute: 0 10*3/uL (ref 0.0–0.1)
EOS ABS: 0.2 10*3/uL (ref 0.0–0.5)
Eosinophils Relative: 3 %
HCT: 28.9 % — ABNORMAL LOW (ref 39.0–52.0)
Hemoglobin: 9.4 g/dL — ABNORMAL LOW (ref 13.0–17.0)
Immature Granulocytes: 1 %
Lymphocytes Relative: 16 %
Lymphs Abs: 1.2 10*3/uL (ref 0.7–4.0)
MCH: 37.5 pg — AB (ref 26.0–34.0)
MCHC: 32.5 g/dL (ref 30.0–36.0)
MCV: 115.1 fL — AB (ref 80.0–100.0)
MONOS PCT: 12 %
Monocytes Absolute: 0.9 10*3/uL (ref 0.1–1.0)
NEUTROS PCT: 67 %
Neutro Abs: 5.1 10*3/uL (ref 1.7–7.7)
PLATELETS: 132 10*3/uL — AB (ref 150–400)
RBC: 2.51 MIL/uL — AB (ref 4.22–5.81)
RDW: 14.7 % (ref 11.5–15.5)
WBC: 7.6 10*3/uL (ref 4.0–10.5)
nRBC: 0 % (ref 0.0–0.2)

## 2017-12-03 LAB — BASIC METABOLIC PANEL
ANION GAP: 5 (ref 5–15)
BUN: 36 mg/dL — ABNORMAL HIGH (ref 8–23)
CALCIUM: 7.6 mg/dL — AB (ref 8.9–10.3)
CO2: 21 mmol/L — ABNORMAL LOW (ref 22–32)
Chloride: 116 mmol/L — ABNORMAL HIGH (ref 98–111)
Creatinine, Ser: 1 mg/dL (ref 0.61–1.24)
GFR calc Af Amer: >60 mL/min (ref >60)
GLUCOSE: 139 mg/dL — AB (ref 70–99)
POTASSIUM: 3.8 mmol/L (ref 3.5–5.1)
SODIUM: 142 mmol/L (ref 135–145)

## 2017-12-03 LAB — GLUCOSE, CAPILLARY
GLUCOSE-CAPILLARY: 144 mg/dL — AB (ref 70–99)
GLUCOSE-CAPILLARY: 144 mg/dL — AB (ref 70–99)
Glucose-Capillary: 137 mg/dL — ABNORMAL HIGH (ref 70–99)
Glucose-Capillary: 125 mg/dL — ABNORMAL HIGH (ref 70–99)
Glucose-Capillary: 136 mg/dL — ABNORMAL HIGH (ref 70–99)

## 2017-12-03 LAB — PROCALCITONIN: Procalcitonin: 5.66 ng/mL

## 2017-12-03 MED ORDER — AZITHROMYCIN 500 MG PO TABS: 500.0000 mg | ORAL_TABLET | Freq: Every day | ORAL | Status: DC

## 2017-12-03 MED ORDER — IPRATROPIUM-ALBUTEROL 0.5-2.5 (3) MG/3ML IN SOLN: 3.0000 mL | Freq: Four times a day (QID) | RESPIRATORY_TRACT | Status: DC | PRN

## 2017-12-03 MED ORDER — CEFAZOLIN SODIUM-DEXTROSE 2-4 GM/100ML-% IV SOLN
2.0000 g | Freq: Three times a day (TID) | INTRAVENOUS | Status: DC
  Administered 2017-12-03 – 2017-12-04 (×4): 2 g via INTRAVENOUS
  Filled 2017-12-03 (×4): qty 100

## 2017-12-03 NOTE — Progress Notes (Signed)
PROGRESS NOTE  Page Mallon HQI:[id number] DOB: 12-Mar-1927 DOA: 11/29/2017 PCP: Drake Leach, MD  HPI/Recap of past 24 hours: Terry Harrell is a 84 y.o. male with medical history significant for acoustic neuroma, trigeminal neuralgia, diabetes, GERD presents to the ED complaining of left hip pain after mechanical fall at home.  History provided by wife as patient is extremely hard of hearing.  Wife stated she normally assists patient during ambulation, but while she was in the bathroom, patient decided to ambulate unassisted, resulting to him falling on his side, denies hitting his head or losing consciousness.  Patient was okay ambulating for about an hour after the fall, later complained of significant left hip pain and was unable to ambulate, hence bringing him to the ED.  Patient denies any chest pain, shortness of breath, palpitations, dizziness, abdominal pain, nausea/vomiting, diarrhea, cough, fever/chills. Wife stated patient is very prone to falling if he gets up unassisted. In the ED, CT left hip showed acute subcapital left femoral neck fracture.  Orthopedics consulted.  Patient admitted for further management.  Today, pt reported feeling better, denies any new complaints.  Assessment/Plan: Principal Problem:   Closed left hip fracture, initial encounter [facility]) Active Problems:   Fall   GERD (gastroesophageal reflux disease)   Diabetes mellitus without complication (HCC)   Trigeminal neuralgia   BPH (benign prostatic hyperplasia)  Sepsis likely 2/2 ?MSSA bacteremia Vs CAP  Fever, leukocytosis, lactic acidosis on 11/30/17 Currently afebrile, with resolved leukocytosis Lactic acidosis 2.1-->3.9-->1.6 s/p IVF Procalcitonin 14.29-->13.06-->5.66 UA with large hgb, trace leukocytes, few bacteria UC with multiple species BC with 4/4 bottle growing staph, repeat BC X 2 NGTD CXR with cardiomegaly with pulmonary consolidation at the left lung base suspicious for atelectasis and/or  pneumonia. Mild vascular congestion TTE to evaluate for endocarditis S/P IV ceftriaxone, azithromycin Start IV Cefazolin ID on board, appreciate recs Monitor closely  AKI Creatinine 1.32 on admission, last in 04/2017 was normal S/P IVF Renal USS: Compatible with medical renal disease Avoid nephrotoxic's Daily BMP  Elevated D-dimer Likely 2/2 sepsis V/Q scan negative for PE  Macrocytic anemia Hemoglobin baseline around 12, currently 9.7 In addition to ?dilutional from IV fluids Anemia panel showed iron 11, sats 8, folate 8.2, Vitamin B12 344 S/P IV feraheme on 10/20, started on Vitamin B12 Daily CBC  Acute subcapital left femoral neck fracture S/P mechanical fall CT left hip showed above Orthopedics on board, plan for percutaneous cannulated screw fixation once stable, likely 12/04/17 DVT PPx, pain management, PT/OT per Ortho  Diabetes mellitus type 2 with neuropathy SSI, Accu-Cheks Continue home gabapentin Hold home metformin  GERD Continue PPI  Trigeminal neuralgia Continue home carbamazepine  BPH Continue Flomax   Code Status: Partial code  Family Communication: None at bedside  Disposition Plan: To be determined   Consultants:  Orthopedics  ID  Procedures:  None  Antimicrobials:  IV cefazolin  DVT prophylaxis: Lovenox   Objective: Vitals:   12/02/17 2018 12/03/17 0532 12/03/17 0911 12/03/17 1404  BP: 107/62 115/75    Pulse: (!) 59 (!) 54 63 80  Resp:  18 18 18   Temp: 98 F (36.7 C) 98.2 F (36.8 C)    TempSrc: Oral Oral    SpO2: 95% 94% 94% 93%  Weight:      Height:        Intake/Output Summary (Last 24 hours) at 12/03/2017 1514 Last data filed at 12/03/2017 0533 Gross per 24 hour  Intake 3392 ml  Output 850 ml  Net 2542  ml   Filed Weights   11/29/17 1532  Weight: 71.2 kg    Exam:   General: NAD  Cardiovascular: S1, S2 present  Respiratory: Diminished BS bilaterally  Abdomen: Soft, NT, ND, BS  present  Musculoskeletal: Chronic bilateral lymphedema  Skin: Normal  Psychiatry: Normal mood   Data Reviewed: CBC: Recent Labs  Lab 11/29/17 1839 12/01/17 0444 12/02/17 0325 12/03/17 0535  WBC 11.3* 19.9* 12.0* 7.6  NEUTROABS 9.2*  --  10.5* 5.1  HGB 12.2* 10.0* 9.7* 9.4*  HCT 36.6* 29.6* 29.2* 28.9*  MCV 112.3* 112.1* 113.6* 115.1*  PLT 209 157 142* 628*   Basic Metabolic Panel: Recent Labs  Lab 11/29/17 1839 12/01/17 0444 12/02/17 0325 12/03/17 0535  NA 136 138 140 142  K 4.3 4.3 3.8 3.8  CL 107 112* 112* 116*  CO2 20* 19* 21* 21*  GLUCOSE 118* 171* 169* 139*  BUN 25* 32* 38* 36*  CREATININE 0.81 1.32* 1.34* 1.00  CALCIUM 8.9 8.0* 7.8* 7.6*   GFR: Estimated Creatinine Clearance: 51.4 mL/min (by C-G formula based on SCr of 1 mg/dL). Liver Function Tests: Recent Labs  Lab 12/01/17 0829  AST 27  ALT 42  ALKPHOS 91  BILITOT 1.1  PROT 5.6*  ALBUMIN 2.6*   No results for input(s): LIPASE, AMYLASE in the last 168 hours. No results for input(s): AMMONIA in the last 168 hours. Coagulation Profile: Recent Labs  Lab 11/29/17 1839  INR 1.07   Cardiac Enzymes: Recent Labs  Lab 12/01/17 0829  CKTOTAL 66   BNP (last 3 results) No results for input(s): PROBNP in the last 8760 hours. HbA1C: No results for input(s): HGBA1C in the last 72 hours. CBG: Recent Labs  Lab 12/02/17 1632 12/02/17 2121 12/03/17 0538 12/03/17 0751 12/03/17 1145  GLUCAP 227* 103* 137* 125* 136*   Lipid Profile: No results for input(s): CHOL, HDL, LDLCALC, TRIG, CHOLHDL, LDLDIRECT in the last 72 hours. Thyroid Function Tests: No results for input(s): TSH, T4TOTAL, FREET4, T3FREE, THYROIDAB in the last 72 hours. Anemia Panel: Recent Labs    12/02/17 0325  VITAMINB12 344  FOLATE 8.2  FERRITIN 304  TIBC 132*  IRON 11*   Urine analysis:    Component Value Date/Time   COLORURINE AMBER (A) 11/30/2017 1357   APPEARANCEUR CLOUDY (A) 11/30/2017 1357   LABSPEC 1.013  11/30/2017 1357   PHURINE 7.0 11/30/2017 1357   GLUCOSEU NEGATIVE 11/30/2017 1357   HGBUR LARGE (A) 11/30/2017 1357   BILIRUBINUR NEGATIVE 11/30/2017 1357   [place] 11/30/2017 1357   PROTEINUR 30 (A) 11/30/2017 1357   NITRITE NEGATIVE 11/30/2017 1357   LEUKOCYTESUR TRACE (A) 11/30/2017 1357   Sepsis Labs: @LABRCNTIP (procalcitonin:4,lacticidven:4)  ) Recent Results (from the past 240 hour(s))  MRSA PCR Screening     Status: None   Collection Time: 11/29/17 11:55 PM  Result Value Ref Range Status   MRSA by PCR NEGATIVE NEGATIVE Final    Comment:        The GeneXpert MRSA Assay (FDA approved for NASAL specimens only), is one component of a comprehensive MRSA colonization surveillance program. It is not intended to diagnose MRSA infection nor to guide or monitor treatment for MRSA infections. Performed at [facility], [place] [address] [place], [place] [address]   Urine Culture     Status: Abnormal   Collection Time: 11/30/17  1:57 PM  Result Value Ref Range Status   Specimen Description URINE, CATHETERIZED  Final   Special Requests   Final  NONE Performed at [facility], [place] [address], [place], [place] [address]    Culture MULTIPLE SPECIES PRESENT, SUGGEST RECOLLECTION (A)  Final   Report Status 12/01/2017 FINAL  Final  Culture, blood (routine x 2)     Status: None (Preliminary result)   Collection Time: 11/30/17  2:14 PM  Result Value Ref Range Status   Specimen Description BLOOD BLOOD RIGHT FOREARM  Final   Special Requests   Final    BOTTLES DRAWN AEROBIC AND ANAEROBIC Blood Culture results may not be optimal due to an excessive volume of blood received in culture bottles   Culture  Setup Time   Final    GRAM POSITIVE COCCI IN CLUSTERS IN BOTH AEROBIC AND ANAEROBIC BOTTLES CRITICAL RESULT CALLED TO, READ BACK BY AND VERIFIED WITH: L.SEAY,PHARMD AT 0418 ON 12/02/17 BY G.[email or web address]    Culture   Final    GRAM POSITIVE COCCI STAPHYLOCOCCUS  SPECIES (COAGULASE NEGATIVE) THE SIGNIFICANCE OF ISOLATING THIS ORGANISM FROM A SINGLE SET OF BLOOD CULTURES WHEN MULTIPLE SETS ARE DRAWN IS UNCERTAIN. PLEASE NOTIFY THE MICROBIOLOGY DEPARTMENT WITHIN ONE WEEK IF SPECIATION AND SENSITIVITIES ARE REQUIRED. Performed at [facility], [place] [address] [place], [place] [address]    Report Status PENDING  Incomplete  Blood Culture ID Panel (Reflexed)     Status: Abnormal   Collection Time: 11/30/17  2:14 PM  Result Value Ref Range Status   Enterococcus species NOT DETECTED NOT DETECTED Final   Listeria monocytogenes NOT DETECTED NOT DETECTED Final   Staphylococcus species DETECTED (A) NOT DETECTED Final    Comment: Methicillin (oxacillin) susceptible coagulase negative staphylococcus. Possible blood culture contaminant (unless isolated from more than one blood culture draw or clinical case suggests pathogenicity). No antibiotic treatment is indicated for blood  culture contaminants. CRITICAL RESULT CALLED TO, READ BACK BY AND VERIFIED WITH: L.SEAY, PHARMD AT 0418 ON 12/02/17 BY G.[email or web address]    Staphylococcus aureus (BCID) NOT DETECTED NOT DETECTED Final   Methicillin resistance NOT DETECTED NOT DETECTED Final   Streptococcus species NOT DETECTED NOT DETECTED Final   Streptococcus agalactiae NOT DETECTED NOT DETECTED Final   Streptococcus pneumoniae NOT DETECTED NOT DETECTED Final   Streptococcus pyogenes NOT DETECTED NOT DETECTED Final   Acinetobacter baumannii NOT DETECTED NOT DETECTED Final   Enterobacteriaceae species NOT DETECTED NOT DETECTED Final   Enterobacter cloacae complex NOT DETECTED NOT DETECTED Final   Escherichia coli NOT DETECTED NOT DETECTED Final   Klebsiella oxytoca NOT DETECTED NOT DETECTED Final   Klebsiella pneumoniae NOT DETECTED NOT DETECTED Final   Proteus species NOT DETECTED NOT DETECTED Final   Serratia marcescens NOT DETECTED NOT DETECTED Final   Haemophilus influenzae NOT DETECTED NOT DETECTED Final    Neisseria meningitidis NOT DETECTED NOT DETECTED Final   Pseudomonas aeruginosa NOT DETECTED NOT DETECTED Final   Candida albicans NOT DETECTED NOT DETECTED Final   Candida glabrata NOT DETECTED NOT DETECTED Final   Candida krusei NOT DETECTED NOT DETECTED Final   Candida parapsilosis NOT DETECTED NOT DETECTED Final   Candida tropicalis NOT DETECTED NOT DETECTED Final    Comment: Performed at [facility], [place]. [address], [place], [place] [address]  Culture, blood (routine x 2)     Status: None (Preliminary result)   Collection Time: 11/30/17  2:19 PM  Result Value Ref Range Status   Specimen Description BLOOD BLOOD LEFT ARM  Final   Special Requests   Final    BOTTLES DRAWN AEROBIC AND ANAEROBIC Blood  Culture adequate volume   Culture  Setup Time   Final    GRAM POSITIVE COCCI IN CLUSTERS IN BOTH AEROBIC AND ANAEROBIC BOTTLES CRITICAL VALUE NOTED.  VALUE IS CONSISTENT WITH PREVIOUSLY REPORTED AND CALLED VALUE.    Culture GRAM POSITIVE COCCI  Final   Report Status PENDING  Incomplete  Culture, blood (routine x 2)     Status: None (Preliminary result)   Collection Time: 12/02/17  3:33 PM  Result Value Ref Range Status   Specimen Description BLOOD LEFT ANTECUBITAL  Final   Special Requests   Final    BOTTLES DRAWN AEROBIC AND ANAEROBIC Blood Culture adequate volume   Culture   Final    NO GROWTH < 24 HOURS Performed at [facility], [address], [place], [place] [address]    Report Status PENDING  Incomplete  Culture, blood (routine x 2)     Status: None (Preliminary result)   Collection Time: 12/02/17  3:39 PM  Result Value Ref Range Status   Specimen Description BLOOD LEFT ARM  Final   Special Requests   Final    BOTTLES DRAWN AEROBIC AND ANAEROBIC Blood Culture adequate volume   Culture   Final    NO GROWTH < 24 HOURS Performed at [facility], [place] [address], [place],  [address]    Report Status PENDING  Incomplete      Studies: No results  found.  Scheduled Meds: . carbamazepine  100 mg Oral QHS  . chlorhexidine  60 mL Topical Once  . enoxaparin (LOVENOX) injection  40 mg Subcutaneous Q24H  . gabapentin  300 mg Oral QHS  . insulin aspart  0-9 Units Subcutaneous TID WC  . Melatonin  3 mg Oral QHS  . pantoprazole  40 mg Oral Daily  . senna  1 tablet Oral BID  . tamsulosin  0.4 mg Oral Daily  . vitamin B-12  1,000 mcg Oral Daily    Continuous Infusions: .  ceFAZolin (ANCEF) IV 2 g (12/03/17 1349)     LOS: 4 days     Alma Friendly, MD [facility]  If 7PM-7AM, please contact night-coverage [email or web address] 12/03/2017, 3:14 PM

## 2017-12-03 NOTE — Care Management Important Message (Signed)
Important Message  Patient Details  Name: Terry Harrell MRN: [id number] Date of Birth: 1927-05-28   Medicare Important Message Given:  Yes    [name] 12/03/2017, 3:25 PM

## 2017-12-03 NOTE — Progress Notes (Signed)
[facility] for Infectious Disease  Date of Admission:  11/29/2017     Total days of antibiotics 4         ASSESSMENT/PLAN  Mr. Scarber experienced a fall resulting in a closed left hip fracture in the setting of fever and leukocytosis revealing Coagulase Negative Staphylococcus bacteremia. Repeat cultures drawn on 10/20 without growth to date. Fever curve and leukocytosis are both improving. Antimicrobial therapy changed to Cefazolin today with the identification of Coag Neg Staph. He is scheduled for hip surgery tomorrow.   1. Continue current dose of Ancef. 2. Monitor repeat cultures and fever curve.    Principal Problem:   Closed left hip fracture, initial encounter [facility]) Active Problems:   Fall   GERD (gastroesophageal reflux disease)   Diabetes mellitus without complication (HCC)   Trigeminal neuralgia   BPH (benign prostatic hyperplasia)   . carbamazepine  100 mg Oral QHS  . chlorhexidine  60 mL Topical Once  . enoxaparin (LOVENOX) injection  40 mg Subcutaneous Q24H  . gabapentin  300 mg Oral QHS  . insulin aspart  0-9 Units Subcutaneous TID WC  . Melatonin  3 mg Oral QHS  . pantoprazole  40 mg Oral Daily  . senna  1 tablet Oral BID  . tamsulosin  0.4 mg Oral Daily  . vitamin B-12  1,000 mcg Oral Daily    SUBJECTIVE:  Mr. Lupercio has remained afebrile and has now resolved leukocytosis. Blood cultures positive for Coagulase Negative Staph in 4/4 bottles. Repeat cultures on 10/20 are without growth to date. Scheduled for left hip surgery tomorrow.   Allergies  Allergen Reactions  . Aspirin Other (See Comments)    Per family aspirin causes him to bleed  . Fentanyl Rash    Review of Systems: Review of Systems  Constitutional: Negative for chills and fever.  Respiratory: Negative for cough, shortness of breath and wheezing.   Cardiovascular: Negative for chest pain and leg swelling.  Gastrointestinal: Negative for abdominal pain, constipation, diarrhea,  nausea and vomiting.  Skin: Negative for rash.    OBJECTIVE: Vitals:   12/02/17 2018 12/03/17 0532 12/03/17 0911 12/03/17 1404  BP: 107/62 115/75    Pulse: (!) 59 (!) 54 63 80  Resp:  18 18 18   Temp: 98 F (36.7 C) 98.2 F (36.8 C)    TempSrc: Oral Oral    SpO2: 95% 94% 94% 93%  Weight:      Height:       Body mass index is 22.53 kg/m.  Physical Exam  Constitutional: He is oriented to person, place, and time. He appears well-developed and well-nourished. No distress.  Lying in bed with head of bed slightly elevated; pleasant.   Cardiovascular: Normal rate, regular rhythm, normal heart sounds and intact distal pulses. Exam reveals no gallop and no friction rub.  No murmur heard. Pulmonary/Chest: Effort normal and breath sounds normal. No stridor. No respiratory distress. He has no rales. He exhibits no tenderness.  Abdominal: Soft. Bowel sounds are normal.  Neurological: He is alert and oriented to person, place, and time.  Skin: Skin is warm and dry.  Psychiatric: He has a normal mood and affect. His behavior is normal. Judgment and thought content normal.    Lab Results Lab Results  Component Value Date   WBC 7.6 12/03/2017   HGB 9.4 (L) 12/03/2017   HCT 28.9 (L) 12/03/2017   MCV 115.1 (H) 12/03/2017   PLT 132 (L) 12/03/2017    Lab Results  Component Value Date   CREATININE 1.00 12/03/2017   BUN 36 (H) 12/03/2017   NA 142 12/03/2017   K 3.8 12/03/2017   CL 116 (H) 12/03/2017   CO2 21 (L) 12/03/2017    Lab Results  Component Value Date   ALT 42 12/01/2017   AST 27 12/01/2017   ALKPHOS 91 12/01/2017   BILITOT 1.1 12/01/2017     Microbiology: Recent Results (from the past 240 hour(s))  MRSA PCR Screening     Status: None   Collection Time: 11/29/17 11:55 PM  Result Value Ref Range Status   MRSA by PCR NEGATIVE NEGATIVE Final    Comment:        The GeneXpert MRSA Assay (FDA approved for NASAL specimens only), is one component of a comprehensive MRSA  colonization surveillance program. It is not intended to diagnose MRSA infection nor to guide or monitor treatment for MRSA infections. Performed at [facility], [place] [address], [place], [place] [address]   Urine Culture     Status: Abnormal   Collection Time: 11/30/17  1:57 PM  Result Value Ref Range Status   Specimen Description URINE, CATHETERIZED  Final   Special Requests   Final    NONE Performed at [facility], [place] [address], [place], [place] [address]    Culture MULTIPLE SPECIES PRESENT, SUGGEST RECOLLECTION (A)  Final   Report Status 12/01/2017 FINAL  Final  Culture, blood (routine x 2)     Status: None (Preliminary result)   Collection Time: 11/30/17  2:14 PM  Result Value Ref Range Status   Specimen Description BLOOD BLOOD RIGHT FOREARM  Final   Special Requests   Final    BOTTLES DRAWN AEROBIC AND ANAEROBIC Blood Culture results may not be optimal due to an excessive volume of blood received in culture bottles   Culture  Setup Time   Final    GRAM POSITIVE COCCI IN CLUSTERS IN BOTH AEROBIC AND ANAEROBIC BOTTLES CRITICAL RESULT CALLED TO, READ BACK BY AND VERIFIED WITH: L.SEAY,PHARMD AT 0418 ON 12/02/17 BY G.[email or web address]    Culture   Final    GRAM POSITIVE COCCI STAPHYLOCOCCUS SPECIES (COAGULASE NEGATIVE) THE SIGNIFICANCE OF ISOLATING THIS ORGANISM FROM A SINGLE SET OF BLOOD CULTURES WHEN MULTIPLE SETS ARE DRAWN IS UNCERTAIN. PLEASE NOTIFY THE MICROBIOLOGY DEPARTMENT WITHIN ONE WEEK IF SPECIATION AND SENSITIVITIES ARE REQUIRED. Performed at [facility], [place] [address], [place], [place] [address]    Report Status PENDING  Incomplete  Blood Culture ID Panel (Reflexed)     Status: Abnormal   Collection Time: 11/30/17  2:14 PM  Result Value Ref Range Status   Enterococcus species NOT DETECTED NOT DETECTED Final   Listeria monocytogenes NOT DETECTED NOT DETECTED Final   Staphylococcus species DETECTED (A) NOT DETECTED Final    Comment: Methicillin  (oxacillin) susceptible coagulase negative staphylococcus. Possible blood culture contaminant (unless isolated from more than one blood culture draw or clinical case suggests pathogenicity). No antibiotic treatment is indicated for blood  culture contaminants. CRITICAL RESULT CALLED TO, READ BACK BY AND VERIFIED WITH: L.SEAY, PHARMD AT 0418 ON 12/02/17 BY G.[email or web address]    Staphylococcus aureus (BCID) NOT DETECTED NOT DETECTED Final   Methicillin resistance NOT DETECTED NOT DETECTED Final   Streptococcus species NOT DETECTED NOT DETECTED Final   Streptococcus agalactiae NOT DETECTED NOT DETECTED Final   Streptococcus pneumoniae NOT DETECTED NOT DETECTED Final   Streptococcus pyogenes NOT DETECTED NOT DETECTED Final   Acinetobacter baumannii NOT  DETECTED NOT DETECTED Final   Enterobacteriaceae species NOT DETECTED NOT DETECTED Final   Enterobacter cloacae complex NOT DETECTED NOT DETECTED Final   Escherichia coli NOT DETECTED NOT DETECTED Final   Klebsiella oxytoca NOT DETECTED NOT DETECTED Final   Klebsiella pneumoniae NOT DETECTED NOT DETECTED Final   Proteus species NOT DETECTED NOT DETECTED Final   Serratia marcescens NOT DETECTED NOT DETECTED Final   Haemophilus influenzae NOT DETECTED NOT DETECTED Final   Neisseria meningitidis NOT DETECTED NOT DETECTED Final   Pseudomonas aeruginosa NOT DETECTED NOT DETECTED Final   Candida albicans NOT DETECTED NOT DETECTED Final   Candida glabrata NOT DETECTED NOT DETECTED Final   Candida krusei NOT DETECTED NOT DETECTED Final   Candida parapsilosis NOT DETECTED NOT DETECTED Final   Candida tropicalis NOT DETECTED NOT DETECTED Final    Comment: Performed at [facility], [place] [address], [place], [place] [address]  Culture, blood (routine x 2)     Status: None (Preliminary result)   Collection Time: 11/30/17  2:19 PM  Result Value Ref Range Status   Specimen Description BLOOD BLOOD LEFT ARM  Final   Special Requests   Final    BOTTLES DRAWN  AEROBIC AND ANAEROBIC Blood Culture adequate volume   Culture  Setup Time   Final    GRAM POSITIVE COCCI IN CLUSTERS IN BOTH AEROBIC AND ANAEROBIC BOTTLES CRITICAL VALUE NOTED.  VALUE IS CONSISTENT WITH PREVIOUSLY REPORTED AND CALLED VALUE.    Culture GRAM POSITIVE COCCI  Final   Report Status PENDING  Incomplete  Culture, blood (routine x 2)     Status: None (Preliminary result)   Collection Time: 12/02/17  3:33 PM  Result Value Ref Range Status   Specimen Description BLOOD LEFT ANTECUBITAL  Final   Special Requests   Final    BOTTLES DRAWN AEROBIC AND ANAEROBIC Blood Culture adequate volume   Culture   Final    NO GROWTH < 24 HOURS Performed at [facility], [address] [place], [place] [address]    Report Status PENDING  Incomplete  Culture, blood (routine x 2)     Status: None (Preliminary result)   Collection Time: 12/02/17  3:39 PM  Result Value Ref Range Status   Specimen Description BLOOD LEFT ARM  Final   Special Requests   Final    BOTTLES DRAWN AEROBIC AND ANAEROBIC Blood Culture adequate volume   Culture   Final    NO GROWTH < 24 HOURS Performed at [facility], [place] [address] [place], [place] [address]    Report Status PENDING  Incomplete     Terri Piedra, [place] [facility] [phone] Pager  12/03/2017  4:09 PM

## 2017-12-03 NOTE — Progress Notes (Signed)
   Subjective: Left femur fracture Still awaiting medical clearance to proceed Would like to proceed tomorrow with surgery if medically stable Pt c/o mild pain in the left hip and leg with movement   Patient reports pain as moderate.  Objective:   VITALS:   Vitals:   12/03/17 0532 12/03/17 0911  BP: 115/75   Pulse: (!) 54 63  Resp: 18 18  Temp: 98.2 F (36.8 C)   SpO2: 94% 94%    Left lower extremity: mild pain with any rom nv intact distally Mild edema distally bilaterally No rashes   LABS Recent Labs    12/01/17 0444 12/02/17 0325 12/03/17 0535  HGB 10.0* 9.7* 9.4*  HCT 29.6* 29.2* 28.9*  WBC 19.9* 12.0* 7.6  PLT 157 142* 132*    Recent Labs    12/01/17 0444 12/02/17 0325 12/03/17 0535  NA 138 140 142  K 4.3 3.8 3.8  BUN 32* 38* 36*  CREATININE 1.32* 1.34* 1.00  GLUCOSE 171* 169* 139*     Assessment/Plan: Left femur fracture with recent sepsis Will continue to monitor his progress Recommend SCDs and dvt prophylaxis Will perform surgery once stable Pain management Decubitus prevention    Brad Luna [place], MPAS [place] is now [facility]  [facility] [address], [place], [place], [place] [address] Phone: [phone] [email or web address] Facebook  [employer]

## 2017-12-03 NOTE — Care Management Important Message (Signed)
Important Message  Patient Details  Name: Terry Harrell MRN: [id number] Date of Birth: 16-Dec-1927   Medicare Important Message Given:  No  Correction :  Patient did not sign due to illness Unsign copy left   Tailey Top 12/03/2017, 3:33 PM

## 2017-12-04 ENCOUNTER — Inpatient Hospital Stay (HOSPITAL_COMMUNITY): Payer: Medicare Other

## 2017-12-04 ENCOUNTER — Inpatient Hospital Stay (HOSPITAL_COMMUNITY): Payer: Medicare Other | Admitting: Critical Care Medicine

## 2017-12-04 ENCOUNTER — Encounter (HOSPITAL_COMMUNITY)
Admission: EM | Disposition: A | Discharge status: Skilled Nursing Facility | Payer: Self-pay | Source: Home / Self Care | Attending: Internal Medicine

## 2017-12-04 ENCOUNTER — Encounter (HOSPITAL_COMMUNITY): Payer: Self-pay | Admitting: Orthopedic Surgery

## 2017-12-04 DIAGNOSIS — R7881 Bacteremia: Secondary | ICD-10-CM

## 2017-12-04 DIAGNOSIS — S72002A Fracture of unspecified part of neck of left femur, initial encounter for closed fracture: Secondary | ICD-10-CM | POA: Diagnosis present

## 2017-12-04 HISTORY — PX: HIP PINNING,CANNULATED: SHX1758

## 2017-12-04 LAB — GLUCOSE, CAPILLARY
GLUCOSE-CAPILLARY: 126 mg/dL — AB (ref 70–99)
GLUCOSE-CAPILLARY: 117 mg/dL — AB (ref 70–99)
GLUCOSE-CAPILLARY: 133 mg/dL — AB (ref 70–99)
GLUCOSE-CAPILLARY: 101 mg/dL — AB (ref 70–99)
Glucose-Capillary: 106 mg/dL — ABNORMAL HIGH (ref 70–99)
Glucose-Capillary: 143 mg/dL — ABNORMAL HIGH (ref 70–99)

## 2017-12-04 LAB — CULTURE, BLOOD (ROUTINE X 2): Special Requests: ADEQUATE

## 2017-12-04 LAB — CREATININE, SERUM
Creatinine, Ser: 0.97 mg/dL (ref 0.61–1.24)
GFR calc Af Amer: >60 mL/min (ref >60)
GFR calc non Af Amer: >60 mL/min (ref >60)

## 2017-12-04 SURGERY — FIXATION, FEMUR, NECK, PERCUTANEOUS, USING SCREW
Anesthesia: General | Laterality: Left

## 2017-12-04 MED ORDER — FENTANYL CITRATE (PF) 100 MCG/2ML IJ SOLN: 25.0000 ug | INTRAMUSCULAR | Status: DC | PRN

## 2017-12-04 MED ORDER — PROPOFOL 10 MG/ML IV BOLUS
INTRAVENOUS | Status: DC | PRN
  Administered 2017-12-04: 30 mg via INTRAVENOUS
  Administered 2017-12-04: 50 mg via INTRAVENOUS
  Administered 2017-12-04: 30 mg via INTRAVENOUS

## 2017-12-04 MED ORDER — POLYETHYLENE GLYCOL 3350 17 G PO PACK: 17.0000 g | PACK | Freq: Every day | ORAL | Status: DC | PRN

## 2017-12-04 MED ORDER — FENTANYL CITRATE (PF) 250 MCG/5ML IJ SOLN
INTRAMUSCULAR | Status: AC
  Filled 2017-12-04: qty 5

## 2017-12-04 MED ORDER — CEFAZOLIN SODIUM-DEXTROSE 2-3 GM-%(50ML) IV SOLR
INTRAVENOUS | Status: DC | PRN
  Administered 2017-12-04: 2 g via INTRAVENOUS

## 2017-12-04 MED ORDER — DOCUSATE SODIUM 100 MG PO CAPS: 100.0000 mg | ORAL_CAPSULE | Freq: Every day | ORAL | Status: DC

## 2017-12-04 MED ORDER — ENOXAPARIN SODIUM 40 MG/0.4ML ~~LOC~~ SOLN
40.0000 mg | SUBCUTANEOUS | Status: DC
  Administered 2017-12-05 – 2017-12-06 (×2): 40 mg via SUBCUTANEOUS
  Filled 2017-12-04 (×2): qty 0.4

## 2017-12-04 MED ORDER — METFORMIN HCL 500 MG PO TABS
500.0000 mg | ORAL_TABLET | Freq: Two times a day (BID) | ORAL | Status: DC
  Administered 2017-12-05: 500 mg via ORAL
  Filled 2017-12-04: qty 1

## 2017-12-04 MED ORDER — ONDANSETRON HCL 4 MG/2ML IJ SOLN: 4.0000 mg | Freq: Four times a day (QID) | INTRAMUSCULAR | Status: DC | PRN

## 2017-12-04 MED ORDER — ONDANSETRON HCL 4 MG PO TABS: 4.0000 mg | ORAL_TABLET | Freq: Four times a day (QID) | ORAL | Status: DC | PRN

## 2017-12-04 MED ORDER — PHENYLEPHRINE 40 MCG/ML (10ML) SYRINGE FOR IV PUSH (FOR BLOOD PRESSURE SUPPORT)
PREFILLED_SYRINGE | INTRAVENOUS | Status: DC | PRN
  Administered 2017-12-04 (×2): 80 ug via INTRAVENOUS

## 2017-12-04 MED ORDER — ROCURONIUM BROMIDE 50 MG/5ML IV SOSY
PREFILLED_SYRINGE | INTRAVENOUS | Status: AC
  Filled 2017-12-04: qty 5

## 2017-12-04 MED ORDER — DOCUSATE SODIUM 100 MG PO CAPS
100.0000 mg | ORAL_CAPSULE | Freq: Two times a day (BID) | ORAL | Status: DC
  Administered 2017-12-04 – 2017-12-07 (×7): 100 mg via ORAL
  Filled 2017-12-04 (×7): qty 1

## 2017-12-04 MED ORDER — METOCLOPRAMIDE HCL 5 MG/ML IJ SOLN: 5.0000 mg | Freq: Three times a day (TID) | INTRAMUSCULAR | Status: DC | PRN

## 2017-12-04 MED ORDER — FENTANYL CITRATE (PF) 100 MCG/2ML IJ SOLN
INTRAMUSCULAR | Status: AC
  Filled 2017-12-04: qty 2

## 2017-12-04 MED ORDER — DEXAMETHASONE SODIUM PHOSPHATE 10 MG/ML IJ SOLN
INTRAMUSCULAR | Status: AC
  Filled 2017-12-04: qty 1

## 2017-12-04 MED ORDER — PROPOFOL 10 MG/ML IV BOLUS
INTRAVENOUS | Status: AC
  Filled 2017-12-04: qty 20

## 2017-12-04 MED ORDER — LACTATED RINGERS IV SOLN
INTRAVENOUS | Status: DC
  Administered 2017-12-04: 16:00:00 via INTRAVENOUS

## 2017-12-04 MED ORDER — ONDANSETRON HCL 4 MG/2ML IJ SOLN
INTRAMUSCULAR | Status: DC | PRN
  Administered 2017-12-04: 4 mg via INTRAVENOUS

## 2017-12-04 MED ORDER — BISACODYL 10 MG RE SUPP: 10.0000 mg | Freq: Every day | RECTAL | Status: DC | PRN

## 2017-12-04 MED ORDER — LIDOCAINE 2% (20 MG/ML) 5 ML SYRINGE
INTRAMUSCULAR | Status: AC
  Filled 2017-12-04: qty 5

## 2017-12-04 MED ORDER — SUGAMMADEX SODIUM 200 MG/2ML IV SOLN
INTRAVENOUS | Status: DC | PRN
  Administered 2017-12-04: 200 mg via INTRAVENOUS

## 2017-12-04 MED ORDER — SODIUM CHLORIDE 0.9 % IV SOLN
INTRAVENOUS | Status: DC
  Administered 2017-12-04: 23:00:00 via INTRAVENOUS

## 2017-12-04 MED ORDER — ONDANSETRON HCL 4 MG/2ML IJ SOLN
INTRAMUSCULAR | Status: AC
  Filled 2017-12-04: qty 2

## 2017-12-04 MED ORDER — DEXAMETHASONE SODIUM PHOSPHATE 10 MG/ML IJ SOLN
INTRAMUSCULAR | Status: DC | PRN
  Administered 2017-12-04: 5 mg via INTRAVENOUS

## 2017-12-04 MED ORDER — CEFAZOLIN SODIUM-DEXTROSE 2-4 GM/100ML-% IV SOLN
2.0000 g | Freq: Four times a day (QID) | INTRAVENOUS | Status: AC
  Administered 2017-12-05 (×2): 2 g via INTRAVENOUS
  Filled 2017-12-04 (×3): qty 100

## 2017-12-04 MED ORDER — ROCURONIUM BROMIDE 50 MG/5ML IV SOSY
PREFILLED_SYRINGE | INTRAVENOUS | Status: DC | PRN
  Administered 2017-12-04: 50 mg via INTRAVENOUS

## 2017-12-04 MED ORDER — PSYLLIUM 95 % PO PACK
1.0000 | PACK | Freq: Every day | ORAL | Status: DC
  Administered 2017-12-05 – 2017-12-07 (×3): 1 via ORAL
  Filled 2017-12-04 (×4): qty 1

## 2017-12-04 MED ORDER — METOCLOPRAMIDE HCL 5 MG PO TABS: 5.0000 mg | ORAL_TABLET | Freq: Three times a day (TID) | ORAL | Status: DC | PRN

## 2017-12-04 MED ORDER — PHENYLEPHRINE 40 MCG/ML (10ML) SYRINGE FOR IV PUSH (FOR BLOOD PRESSURE SUPPORT)
PREFILLED_SYRINGE | INTRAVENOUS | Status: AC
  Filled 2017-12-04: qty 10

## 2017-12-04 MED ORDER — PENICILLIN V POTASSIUM 250 MG PO TABS
500.0000 mg | ORAL_TABLET | Freq: Four times a day (QID) | ORAL | Status: DC
  Filled 2017-12-04: qty 2

## 2017-12-04 MED ORDER — GLYCOPYRROLATE PF 0.2 MG/ML IJ SOSY
PREFILLED_SYRINGE | INTRAMUSCULAR | Status: DC | PRN
  Administered 2017-12-04: .2 mg via INTRAVENOUS

## 2017-12-04 MED ORDER — PENICILLIN G POTASSIUM 20000000 UNITS IJ SOLR
9.0000 10*6.[IU] | Freq: Two times a day (BID) | INTRAVENOUS | Status: DC
  Administered 2017-12-05 – 2017-12-07 (×6): 9 10*6.[IU] via INTRAVENOUS
  Filled 2017-12-04 (×9): qty 9

## 2017-12-04 MED ORDER — SODIUM CHLORIDE 0.9 % IV SOLN
1000.0000 mg | Freq: Once | INTRAVENOUS | Status: AC
  Administered 2017-12-04: 1000 mg via INTRAVENOUS
  Filled 2017-12-04: qty 20

## 2017-12-04 MED ORDER — ONDANSETRON HCL 4 MG/2ML IJ SOLN: 4.0000 mg | Freq: Once | INTRAMUSCULAR | Status: DC | PRN

## 2017-12-04 MED ORDER — LIDOCAINE 2% (20 MG/ML) 5 ML SYRINGE
INTRAMUSCULAR | Status: DC | PRN
  Administered 2017-12-04: 40 mg via INTRAVENOUS

## 2017-12-04 MED ORDER — PENICILLIN G POTASSIUM 20000000 UNITS IJ SOLR
2.0000 10*6.[IU] | INTRAVENOUS | Status: DC
  Filled 2017-12-04: qty 2

## 2017-12-04 MED ORDER — LABETALOL HCL 5 MG/ML IV SOLN
INTRAVENOUS | Status: AC
  Filled 2017-12-04: qty 4

## 2017-12-04 MED ORDER — PSYLLIUM 0.52 G PO CAPS: 0.5200 g | ORAL_CAPSULE | Freq: Every day | ORAL | Status: DC

## 2017-12-04 MED ORDER — ACETAMINOPHEN 325 MG PO TABS: 650.0000 mg | ORAL_TABLET | Freq: Four times a day (QID) | ORAL | Status: DC | PRN

## 2017-12-04 MED ORDER — FENTANYL CITRATE (PF) 100 MCG/2ML IJ SOLN
INTRAMUSCULAR | Status: DC | PRN
  Administered 2017-12-04: 25 ug via INTRAVENOUS
  Administered 2017-12-04: 50 ug via INTRAVENOUS
  Administered 2017-12-04: 25 ug via INTRAVENOUS
  Administered 2017-12-04 (×2): 50 ug via INTRAVENOUS

## 2017-12-04 SURGICAL SUPPLY — 42 items
BIT DRILL 4.8X200 CANN (BIT) ×3 IMPLANT
COVER PERINEAL POST (MISCELLANEOUS) ×3 IMPLANT
COVER WAND RF STERILE (DRAPES) IMPLANT
DRAPE STERI IOBAN 125X83 (DRAPES) ×3 IMPLANT
DRAPE U-SHAPE 47X51 STRL (DRAPES) ×3 IMPLANT
DRSG MEPILEX BORDER 4X4 (GAUZE/BANDAGES/DRESSINGS) ×3 IMPLANT
DURAPREP 26ML APPLICATOR (WOUND CARE) ×3 IMPLANT
ELECT CAUTERY BLADE 6.4 (BLADE) IMPLANT
ELECT REM PT RETURN 9FT ADLT (ELECTROSURGICAL) ×3
ELECTRODE REM PT RTRN 9FT ADLT (ELECTROSURGICAL) ×1 IMPLANT
FACESHIELD WRAPAROUND (MASK) ×6 IMPLANT
GLOVE BIOGEL PI ORTHO PRO 7.5 (GLOVE) ×2
GLOVE BIOGEL PI ORTHO PRO SZ8 (GLOVE) ×2
GLOVE ORTHO TXT STRL SZ7.5 (GLOVE) ×3 IMPLANT
GLOVE PI ORTHO PRO STRL 7.5 (GLOVE) ×1 IMPLANT
GLOVE PI ORTHO PRO STRL SZ8 (GLOVE) ×1 IMPLANT
GLOVE SURG ORTHO 8.5 STRL (GLOVE) ×3 IMPLANT
GOWN STRL REUS W/ TWL LRG LVL3 (GOWN DISPOSABLE) ×2 IMPLANT
GOWN STRL REUS W/ TWL XL LVL3 (GOWN DISPOSABLE) ×2 IMPLANT
GOWN STRL REUS W/TWL LRG LVL3 (GOWN DISPOSABLE) ×4
GOWN STRL REUS W/TWL XL LVL3 (GOWN DISPOSABLE) ×4
KIT TURNOVER KIT B (KITS) ×3 IMPLANT
LINER BOOT UNIVERSAL DISP (MISCELLANEOUS) IMPLANT
MANIFOLD NEPTUNE II (INSTRUMENTS) IMPLANT
NS IRRIG 1000ML POUR BTL (IV SOLUTION) ×3 IMPLANT
PACK GENERAL/GYN (CUSTOM PROCEDURE TRAY) ×3 IMPLANT
PAD ARMBOARD 7.5X6 YLW CONV (MISCELLANEOUS) ×6 IMPLANT
PIN GUIDE DRILL TIP 2.8X300 (DRILL) ×9 IMPLANT
SCREW CANN FT 95X8 NS LNG (Screw) ×1 IMPLANT
SCREW CANNULATED 6.5X95 HIP (Screw) ×6 IMPLANT
SCREW CANNULATED 8.0X95 (Screw) ×2 IMPLANT
STAPLER VISISTAT 35W (STAPLE) ×3 IMPLANT
SUT ETHILON 3 0 FSL (SUTURE) IMPLANT
SUT VIC AB 0 CT1 27 (SUTURE)
SUT VIC AB 0 CT1 27XBRD ANBCTR (SUTURE) IMPLANT
SUT VIC AB 1 CT1 27 (SUTURE)
SUT VIC AB 1 CT1 27XBRD ANBCTR (SUTURE) IMPLANT
SUT VIC AB 2-0 CT1 27 (SUTURE) ×2
SUT VIC AB 2-0 CT1 TAPERPNT 27 (SUTURE) ×1 IMPLANT
TOWEL OR 17X24 6PK STRL BLUE (TOWEL DISPOSABLE) ×3 IMPLANT
TOWEL OR 17X26 10 PK STRL BLUE (TOWEL DISPOSABLE) ×3 IMPLANT
WATER STERILE IRR 1000ML POUR (IV SOLUTION) ×3 IMPLANT

## 2017-12-04 NOTE — Progress Notes (Signed)
PROGRESS NOTE  Terry Harrell MGN:[id number] DOB: 12-02-1927 DOA: 11/29/2017 PCP: Drake Leach, MD  HPI/Recap of past 24 hours: Terry Harrell is a 84 y.o. male with medical history significant for acoustic neuroma, trigeminal neuralgia, diabetes, GERD presents to the ED complaining of left hip pain after mechanical fall at home.  History provided by wife as patient is extremely hard of hearing.  Wife stated she normally assists patient during ambulation, but while she was in the bathroom, patient decided to ambulate unassisted, resulting to him falling on his side, denies hitting his head or losing consciousness.  Patient was okay ambulating for about an hour after the fall, later complained of significant left hip pain and was unable to ambulate, hence bringing him to the ED.  Patient denies any chest pain, shortness of breath, palpitations, dizziness, abdominal pain, nausea/vomiting, diarrhea, cough, fever/chills. Wife stated patient is very prone to falling if he gets up unassisted. In the ED, CT left hip showed acute subcapital left femoral neck fracture.  Orthopedics consulted.  Patient admitted for further management.  Today, pt reported feeling better, denies any new complaints. Patient stable for surgery from ID standpoint  Assessment/Plan: Principal Problem:   Closed left hip fracture, initial encounter [facility]) Active Problems:   Fall   GERD (gastroesophageal reflux disease)   Diabetes mellitus without complication (HCC)   Trigeminal neuralgia   BPH (benign prostatic hyperplasia)  Sepsis likely 2/2 granulicatella adiacens bacteremia (source likely from dentition) Vs CAP  Fever, leukocytosis, lactic acidosis on 11/30/17 Currently afebrile, with resolved leukocytosis Lactic acidosis 2.1-->3.9-->1.6 s/p IVF Procalcitonin 14.29-->13.06-->5.66 UA with large hgb, trace leukocytes, few bacteria UC with multiple species BC with 4/4 bottle growing granulicatella adiacens repeat BC X 2  NGTD CXR with cardiomegaly with pulmonary consolidation at the left lung base suspicious for atelectasis and/or pneumonia. Mild vascular congestion TTE to evaluate for endocarditis, no obvious vegetations S/P IV ceftriaxone, azithromycin--> s/p cefazolin ID on board: G. adiacens sensitive to Vancomycin, erythromycin, levo. Micro reports sensitivity to PCN, ceftriaxone. ID ok to switch to PCN for a total of 2 weeks Monitor closely  AKI Resolved Creatinine 1.32 on admission, last in 04/2017 was normal S/P IVF Renal USS: Compatible with medical renal disease Avoid nephrotoxic's Daily BMP  Elevated D-dimer Likely 2/2 sepsis V/Q scan negative for PE  Macrocytic anemia Hemoglobin baseline around 12, currently 9.4 In addition to ?dilutional from IV fluids Anemia panel showed iron 11, sats 8, folate 8.2, Vitamin B12->344 S/P IV feraheme on 10/20, started on PO Vitamin B12 Daily CBC  Acute subcapital left femoral neck fracture S/P mechanical fall CT left hip showed above Orthopedics on board, plan for percutaneous cannulated screw fixation once stable, likely 12/04/17 DVT PPx, pain management, PT/OT per Ortho  Diabetes mellitus type 2 with neuropathy SSI, Accu-Cheks Continue home gabapentin Hold home metformin  GERD Continue PPI  Trigeminal neuralgia Continue home carbamazepine  BPH Continue Flomax   Code Status: Partial code  Family Communication: None at bedside  Disposition Plan: To be determined   Consultants:  Orthopedics  ID  Procedures:  None  Antimicrobials:  PO PCN  DVT prophylaxis: Lovenox   Objective: Vitals:   12/03/17 1835 12/03/17 1950 12/04/17 0420 12/04/17 0859  BP: 108/64 116/69 124/69 (!) 143/80  Pulse: 89 63 65 64  Resp: 18 20 17 18   Temp: 98.5 F (36.9 C) 99.1 F (37.3 C) 98.4 F (36.9 C) 98.1 F (36.7 C)  TempSrc:  Oral Axillary Oral  SpO2: 93% (!) 66% 93% Marland Kitchen)  89%  Weight:      Height:       No intake or output  data in the 24 hours ending 12/04/17 1214 Filed Weights   11/29/17 1532  Weight: 71.2 kg    Exam:   General: NAD  Cardiovascular: S1, S2 present  Respiratory: Diminished BS bilaterally  Abdomen: Soft, NT, ND, BS present  Musculoskeletal: Chronic bilateral lymphedema (2+ non-pitting edema b/l)  Skin: Normal  Psychiatry: Normal mood   Data Reviewed: CBC: Recent Labs  Lab 11/29/17 1839 12/01/17 0444 12/02/17 0325 12/03/17 0535  WBC 11.3* 19.9* 12.0* 7.6  NEUTROABS 9.2*  --  10.5* 5.1  HGB 12.2* 10.0* 9.7* 9.4*  HCT 36.6* 29.6* 29.2* 28.9*  MCV 112.3* 112.1* 113.6* 115.1*  PLT 209 157 142* 322*   Basic Metabolic Panel: Recent Labs  Lab 11/29/17 1839 12/01/17 0444 12/02/17 0325 12/03/17 0535  NA 136 138 140 142  K 4.3 4.3 3.8 3.8  CL 107 112* 112* 116*  CO2 20* 19* 21* 21*  GLUCOSE 118* 171* 169* 139*  BUN 25* 32* 38* 36*  CREATININE 0.81 1.32* 1.34* 1.00  CALCIUM 8.9 8.0* 7.8* 7.6*   GFR: Estimated Creatinine Clearance: 51.4 mL/min (by C-G formula based on SCr of 1 mg/dL). Liver Function Tests: Recent Labs  Lab 12/01/17 0829  AST 27  ALT 42  ALKPHOS 91  BILITOT 1.1  PROT 5.6*  ALBUMIN 2.6*   No results for input(s): LIPASE, AMYLASE in the last 168 hours. No results for input(s): AMMONIA in the last 168 hours. Coagulation Profile: Recent Labs  Lab 11/29/17 1839  INR 1.07   Cardiac Enzymes: Recent Labs  Lab 12/01/17 0829  CKTOTAL 66   BNP (last 3 results) No results for input(s): PROBNP in the last 8760 hours. HbA1C: No results for input(s): HGBA1C in the last 72 hours. CBG: Recent Labs  Lab 12/03/17 1145 12/03/17 1627 12/03/17 2125 12/04/17 0659 12/04/17 0902  GLUCAP 136* 144* 144* 126* 117*   Lipid Profile: No results for input(s): CHOL, HDL, LDLCALC, TRIG, CHOLHDL, LDLDIRECT in the last 72 hours. Thyroid Function Tests: No results for input(s): TSH, T4TOTAL, FREET4, T3FREE, THYROIDAB in the last 72 hours. Anemia  Panel: Recent Labs    12/02/17 0325  VITAMINB12 344  FOLATE 8.2  FERRITIN 304  TIBC 132*  IRON 11*   Urine analysis:    Component Value Date/Time   COLORURINE AMBER (A) 11/30/2017 1357   APPEARANCEUR CLOUDY (A) 11/30/2017 1357   LABSPEC 1.013 11/30/2017 1357   PHURINE 7.0 11/30/2017 1357   GLUCOSEU NEGATIVE 11/30/2017 1357   HGBUR LARGE (A) 11/30/2017 1357   BILIRUBINUR NEGATIVE 11/30/2017 1357   [place] 11/30/2017 1357   PROTEINUR 30 (A) 11/30/2017 1357   NITRITE NEGATIVE 11/30/2017 1357   LEUKOCYTESUR TRACE (A) 11/30/2017 1357   Sepsis Labs: @LABRCNTIP (procalcitonin:4,lacticidven:4)  ) Recent Results (from the past 240 hour(s))  MRSA PCR Screening     Status: None   Collection Time: 11/29/17 11:55 PM  Result Value Ref Range Status   MRSA by PCR NEGATIVE NEGATIVE Final    Comment:        The GeneXpert MRSA Assay (FDA approved for NASAL specimens only), is one component of a comprehensive MRSA colonization surveillance program. It is not intended to diagnose MRSA infection nor to guide or monitor treatment for MRSA infections. Performed at [facility], [place] [address], [place], [place] [address]   Urine Culture     Status: Abnormal   Collection  Time: 11/30/17  1:57 PM  Result Value Ref Range Status   Specimen Description URINE, CATHETERIZED  Final   Special Requests   Final    NONE Performed at [facility], [address], [place], [place] [address]    Culture MULTIPLE SPECIES PRESENT, SUGGEST RECOLLECTION (A)  Final   Report Status 12/01/2017 FINAL  Final  Culture, blood (routine x 2)     Status: Abnormal (Preliminary result)   Collection Time: 11/30/17  2:14 PM  Result Value Ref Range Status   Specimen Description BLOOD BLOOD RIGHT FOREARM  Final   Special Requests   Final    BOTTLES DRAWN AEROBIC AND ANAEROBIC Blood Culture results may not be optimal due to an excessive volume of blood received in culture bottles   Culture   Setup Time   Final    GRAM POSITIVE COCCI IN CLUSTERS IN BOTH AEROBIC AND ANAEROBIC BOTTLES CRITICAL RESULT CALLED TO, READ BACK BY AND VERIFIED WITH: L.SEAY,PHARMD AT 0418 ON 12/02/17 BY G.[email or web address]    Culture (A)  Final    GRANULICATELLA ADIACENS SUSCEPTIBILITIES PERFORMED ON PREVIOUS CULTURE WITHIN THE LAST 5 DAYS. STAPHYLOCOCCUS CAPITIS SUSCEPTIBILITIES TO FOLLOW Performed at [facility], [place] [address], [place], [place] [address]    Report Status PENDING  Incomplete  Blood Culture ID Panel (Reflexed)     Status: Abnormal   Collection Time: 11/30/17  2:14 PM  Result Value Ref Range Status   Enterococcus species NOT DETECTED NOT DETECTED Final   Listeria monocytogenes NOT DETECTED NOT DETECTED Final   Staphylococcus species DETECTED (A) NOT DETECTED Final    Comment: Methicillin (oxacillin) susceptible coagulase negative staphylococcus. Possible blood culture contaminant (unless isolated from more than one blood culture draw or clinical case suggests pathogenicity). No antibiotic treatment is indicated for blood  culture contaminants. CRITICAL RESULT CALLED TO, READ BACK BY AND VERIFIED WITH: L.SEAY, PHARMD AT 0418 ON 12/02/17 BY G.[email or web address]    Staphylococcus aureus (BCID) NOT DETECTED NOT DETECTED Final   Methicillin resistance NOT DETECTED NOT DETECTED Final   Streptococcus species NOT DETECTED NOT DETECTED Final   Streptococcus agalactiae NOT DETECTED NOT DETECTED Final   Streptococcus pneumoniae NOT DETECTED NOT DETECTED Final   Streptococcus pyogenes NOT DETECTED NOT DETECTED Final   Acinetobacter baumannii NOT DETECTED NOT DETECTED Final   Enterobacteriaceae species NOT DETECTED NOT DETECTED Final   Enterobacter cloacae complex NOT DETECTED NOT DETECTED Final   Escherichia coli NOT DETECTED NOT DETECTED Final   Klebsiella oxytoca NOT DETECTED NOT DETECTED Final   Klebsiella pneumoniae NOT DETECTED NOT DETECTED Final   Proteus species NOT DETECTED NOT DETECTED Final    Serratia marcescens NOT DETECTED NOT DETECTED Final   Haemophilus influenzae NOT DETECTED NOT DETECTED Final   Neisseria meningitidis NOT DETECTED NOT DETECTED Final   Pseudomonas aeruginosa NOT DETECTED NOT DETECTED Final   Candida albicans NOT DETECTED NOT DETECTED Final   Candida glabrata NOT DETECTED NOT DETECTED Final   Candida krusei NOT DETECTED NOT DETECTED Final   Candida parapsilosis NOT DETECTED NOT DETECTED Final   Candida tropicalis NOT DETECTED NOT DETECTED Final    Comment: Performed at [facility], [place]. [address] [place], [place] [address]  Culture, blood (routine x 2)     Status: Abnormal   Collection Time: 11/30/17  2:19 PM  Result Value Ref Range Status   Specimen Description BLOOD BLOOD LEFT ARM  Final   Special Requests   Final    BOTTLES DRAWN AEROBIC  AND ANAEROBIC Blood Culture adequate volume   Culture  Setup Time   Final    GRAM POSITIVE COCCI IN CLUSTERS IN BOTH AEROBIC AND ANAEROBIC BOTTLES CRITICAL VALUE NOTED.  VALUE IS CONSISTENT WITH PREVIOUSLY REPORTED AND CALLED VALUE.    Culture GRANULICATELLA ADIACENS (A)  Final   Report Status 12/04/2017 FINAL  Final   Organism ID, Bacteria GRANULICATELLA ADIACENS  Final      Susceptibility   Granulicatella adiacens - MIC*    ERYTHROMYCIN <=0.12 SENSITIVE Sensitive     LEVOFLOXACIN <=0.25 SENSITIVE Sensitive     VANCOMYCIN 0.25 SENSITIVE Sensitive     * GRANULICATELLA ADIACENS  Culture, blood (routine x 2)     Status: None (Preliminary result)   Collection Time: 12/02/17  3:33 PM  Result Value Ref Range Status   Specimen Description BLOOD LEFT ANTECUBITAL  Final   Special Requests   Final    BOTTLES DRAWN AEROBIC AND ANAEROBIC Blood Culture adequate volume   Culture   Final    NO GROWTH 2 DAYS Performed at [facility], [place] [address], [place], [place] [address]    Report Status PENDING  Incomplete  Culture, blood (routine x 2)     Status: None (Preliminary result)   Collection Time: 12/02/17   3:39 PM  Result Value Ref Range Status   Specimen Description BLOOD LEFT ARM  Final   Special Requests   Final    BOTTLES DRAWN AEROBIC AND ANAEROBIC Blood Culture adequate volume   Culture   Final    NO GROWTH 2 DAYS Performed at [facility], [address] [place], [place] [address]    Report Status PENDING  Incomplete      Studies: No results found.  Scheduled Meds: . carbamazepine  100 mg Oral QHS  . chlorhexidine  60 mL Topical Once  . enoxaparin (LOVENOX) injection  40 mg Subcutaneous Q24H  . gabapentin  300 mg Oral QHS  . insulin aspart  0-9 Units Subcutaneous TID WC  . Melatonin  3 mg Oral QHS  . pantoprazole  40 mg Oral Daily  . senna  1 tablet Oral BID  . tamsulosin  0.4 mg Oral Daily  . vitamin B-12  1,000 mcg Oral Daily    Continuous Infusions: .  ceFAZolin (ANCEF) IV 2 g (12/04/17 0502)     LOS: 5 days     Alma Friendly, MD [facility]  If 7PM-7AM, please contact night-coverage [email or web address] 12/04/2017, 12:14 PM

## 2017-12-04 NOTE — Plan of Care (Signed)
  Problem: Activity: Goal: Ability to ambulate and perform ADLs will improve Outcome: Progressing   Problem: Clinical Measurements: Goal: Postoperative complications will be avoided or minimized Outcome: Progressing   Problem: Pain Management: Goal: Pain level will decrease Outcome: Progressing   Problem: Clinical Measurements: Goal: Ability to maintain clinical measurements within normal limits will improve Outcome: Progressing Goal: Respiratory complications will improve Outcome: Progressing   Problem: Clinical Measurements: Goal: Respiratory complications will improve Outcome: Progressing

## 2017-12-04 NOTE — Progress Notes (Signed)
ID PROGRESS NOTE  Patient has nutritional variant strep species -- granucitella bacteremia. Micro reports S to PCN,CTX.   Will switch his abtx to PCN  - plan on treating for 2 wks. - TTE did not suggest having signs of endocarditis, appears to resolved not isolated on further repeat blood cx - has history of poor dentition which could likely be the source

## 2017-12-04 NOTE — Plan of Care (Signed)
  Problem: Safety: Goal: Ability to remain free from injury will improve Outcome: Progressing   

## 2017-12-04 NOTE — Progress Notes (Signed)
Orthopedics Progress Note  Subjective: Patient comfortable this morning.  Still with a bit of a cough  Objective:  Vitals:   12/03/17 1950 12/04/17 0420  BP: 116/69 124/69  Pulse: 63 65  Resp: 20 17  Temp: 99.1 F (37.3 C) 98.4 F (36.9 C)  SpO2: (!) 66% 93%    General: Awake and alert  Musculoskeletal: reponds to commands. Moderate edema in both legs, good active ankle ROM Neg cords and Homans Neurovascularly intact  Lab Results  Component Value Date   WBC 7.6 12/03/2017   HGB 9.4 (L) 12/03/2017   HCT 28.9 (L) 12/03/2017   MCV 115.1 (H) 12/03/2017   PLT 132 (L) 12/03/2017       Component Value Date/Time   NA 142 12/03/2017 0535   K 3.8 12/03/2017 0535   CL 116 (H) 12/03/2017 0535   CO2 21 (L) 12/03/2017 0535   GLUCOSE 139 (H) 12/03/2017 0535   BUN 36 (H) 12/03/2017 0535   CREATININE 1.00 12/03/2017 0535   CALCIUM 7.6 (L) 12/03/2017 0535   GFRNONAA >60 12/03/2017 0535   GFRAA >60 12/03/2017 0535    Lab Results  Component Value Date   INR 1.07 11/29/2017    Assessment/Plan: Minimally displaced left hip fracture PERCUTANEOUS CANNULATED SCREW FIXATION LEFT HIP planned for later today pending medical clearance Echo pending DVT prophylaxis and decubitus precautions  Remo Lipps R. Veverly Fells, MD 12/04/2017 7:45 AM

## 2017-12-04 NOTE — Anesthesia Procedure Notes (Signed)
Procedure Name: Intubation Date/Time: 12/04/2017 5:28 PM Performed by: Alain Marion, CRNA Pre-anesthesia Checklist: Patient identified, Emergency Drugs available, Suction available and Patient being monitored Patient Re-evaluated:Patient Re-evaluated prior to induction Oxygen Delivery Method: Circle System Utilized Preoxygenation: Pre-oxygenation with 100% oxygen Induction Type: IV induction Ventilation: Mask ventilation without difficulty Laryngoscope Size: Miller and 2 Grade View: Grade I Tube type: Oral Tube size: 7.5 mm Number of attempts: 1 Airway Equipment and Method: Stylet and Oral airway Placement Confirmation: ETT inserted through vocal cords under direct vision,  positive ETCO2 and breath sounds checked- equal and bilateral Secured at: 23 cm Tube secured with: Tape Dental Injury: Teeth and Oropharynx as per pre-operative assessment

## 2017-12-04 NOTE — Op Note (Signed)
NAMESIGMOND, PATALANO MEDICAL RECORD [id number] ACCOUNT [id number] DATE OF BIRTH:04-Feb-1928 FACILITY: [facility] LOCATION: [facility] PHYSICIAN:STEVEN Orlena Sheldon, MD  OPERATIVE REPORT  DATE OF PROCEDURE:  12/04/2017  PREOPERATIVE DIAGNOSIS:  Left valgus impacted femoral neck fracture.  POSTOPERATIVE DIAGNOSIS:  Left valgus impacted femoral neck fracture.  PROCEDURE PERFORMED:  Cannulated screw fixation of valgus impacted femoral neck fracture using Biomet 8.3 cannulated screw and 6.5 mm x2 mm cannulated screws.  ATTENDING SURGEON:  Esmond Plants, MD  ASSISTANT:  None.  ANESTHESIA:  General anesthesia was used.  ESTIMATED BLOOD LOSS:  Minimal.  FLUID REPLACEMENT:  1000 mL crystalloid.  INSTRUMENT COUNTS:  Correct.  COMPLICATIONS:  None.  ANTIBIOTICS:  Perioperative antibiotics were given.  INDICATIONS:  The patient is an 84 year old male who suffered a ground-level fall injuring his left hip.  The patient presented with hip pain, and despite negative x-rays initially, had a CT scan which showed a valgus impacted femoral neck fracture.   This was reviewed with multiple hip surgeons.  It was felt this was amenable to percutaneous cannulated screw fixation.  We counseled the patient and his family regarding the need for surgery to stabilize the femoral neck fracture which at this point is  basically nondisplaced and prevent displacement which would require a larger surgery which would place the patient at risk for perioperative morbidity and mortality.  The patient did undergo preoperative evaluation by internal medicine and infectious  disease for a fever.  This was stabilized.  The patient's white count was normalized, and he was deemed safe for surgery including having an echocardiogram today.  The patient presents now for operative treatment to stabilize his hip.  Informed consent  was obtained.  DESCRIPTION OF PROCEDURE:  After an adequate level of anesthesia was achieved, the patient  was positioned on the fracture table.  Perineal post utilized.  The patient positioned appropriately against that upper extremity secured, and the patient secured  to the fracture table.  The right leg placed in the modified lithotomy position and padded appropriately.  The left leg placed in the traction boot, and just minimal traction was placed with the knee pointed straight up.  We brought the C-arm in to make  sure that we had good visualization of the hip area.  Once we confirmed that, we sterilely prepped and draped the hip and leg.  Time-out was called.  We used a shower curtain, and then brought the C-arm into the field.  Using multiplanar C-arm, we were  able to verify our starting point for our 6.5 cannulated screws and our 8.3 screw inferiorly.  We used cannulated screw guide pins, placed through the lateral greater trochanter and then up across the fracture site and into the femoral head.  We did an  8.3 screw inferiorly and then two 6.5 screws superiorly positioned in a triangle to provide the best fixation.  Once we had our pins placed on multiplanar views, we drilled the outer cortex.  We then placed the 8.3 screw into the subchondral bone and  them the two 6.5 screws into the subchondral bone.  We had excellent purchase.  We irrigated thoroughly and used staples to close the skin.  The patient was transported back onto the recovery room stretcher in stable condition, having tolerated surgery  well.  LN/NUANCE  D:12/04/2017 T:12/04/2017 JOB:[id number]

## 2017-12-04 NOTE — Progress Notes (Signed)
  Echocardiogram 2D Echocardiogram has been performed.  Bobbye Charleston 12/04/2017, 12:14 PM

## 2017-12-04 NOTE — Progress Notes (Signed)
Pt c/o severe jaw pain, likely due to exacerbation of his trigeminal neuralgia, which has worsened post op, preventing patient from eating. Spoke to neurology, recommend giving patient IV dilantin one time (pharmacy to dose) and continue current dose of carbamazepine. If pain persists, please consult neurology formally to follow. Prior to receiving dilantin ensure BP is very stable as it may cause hypotension. LFTs and CBC stable to be given (mild thrombocytopenia, ok with neurology to give). Monitor closely.

## 2017-12-04 NOTE — Transfer of Care (Signed)
Immediate Anesthesia Transfer of Care Note  Patient: Terry Harrell  Procedure(s) Performed: PERCUTANEOUS CANNULATED SCREW FIXATION LEFT HIP (Left )  Patient Location: PACU  Anesthesia Type:General  Level of Consciousness: awake, alert  and oriented  Airway & Oxygen Therapy: Patient Spontanous Breathing and Patient connected to nasal cannula oxygen  Post-op Assessment: Report given to RN and Post -op Vital signs reviewed and stable  Post vital signs: Reviewed and stable  Last Vitals:  Vitals Value Taken Time  BP 130/79 12/04/2017  6:37 PM  Temp 36.4 C 12/04/2017  6:37 PM  Pulse 76 12/04/2017  6:41 PM  Resp 21 12/04/2017  6:41 PM  SpO2 93 % 12/04/2017  6:41 PM  Vitals shown include unvalidated device data.  Last Pain:  Vitals:   12/04/17 1837  TempSrc:   PainSc: (P) Asleep         Complications: No apparent anesthesia complications

## 2017-12-04 NOTE — Progress Notes (Signed)
Pharmacy has been asked to enter phenytoin for trigeminal neuralgia. Per d/w Dr. Horris Latino, will give patient 1000mg  of phenytoin IV x 1 (per her d/w neurology).   Thank you for the consult.   Christeen Lai A. Levada Dy, PharmD, [place] Pager: [phone] Please utilize Amion for appropriate phone number to reach the unit pharmacist ([place])

## 2017-12-04 NOTE — Anesthesia Postprocedure Evaluation (Signed)
Anesthesia Post Note  Patient: Terry Harrell  Procedure(s) Performed: PERCUTANEOUS CANNULATED SCREW FIXATION LEFT HIP (Left )     Patient location during evaluation: PACU Anesthesia Type: General Level of consciousness: awake and alert Pain management: pain level controlled Vital Signs Assessment: post-procedure vital signs reviewed and stable Respiratory status: spontaneous breathing, nonlabored ventilation, respiratory function stable and patient connected to nasal cannula oxygen Cardiovascular status: blood pressure returned to baseline and stable Postop Assessment: no apparent nausea or vomiting Anesthetic complications: no    Last Vitals:  Vitals:   12/04/17 2035 12/04/17 2138  BP: (!) 152/98 (!) 145/80  Pulse: 75   Resp: 20   Temp:    SpO2: 94%     Last Pain:  Vitals:   12/04/17 1837  TempSrc:   PainSc: Asleep                 [name]

## 2017-12-04 NOTE — Brief Op Note (Signed)
12/04/2017  6:43 PM  PATIENT:  Terry Harrell  84 y.o. male  PRE-OPERATIVE DIAGNOSIS:  hip fracture, valgus impacted femoral neck fracture  POST-OPERATIVE DIAGNOSIS:  hip fracture, valgus impacted femoral neck fracture  PROCEDURE:  Procedure(s): PERCUTANEOUS CANNULATED SCREW FIXATION LEFT HIP (Left)8.21mm and  6.5 mm x2  Biomet cannulated screws  SURGEON:  Surgeon(s) and Role:    Netta Cedars, MD - Primary  PHYSICIAN ASSISTANT:   ASSISTANTS: none   ANESTHESIA:   general  EBL:  25 mL   BLOOD ADMINISTERED:none  DRAINS: none   LOCAL MEDICATIONS USED:  NONE  SPECIMEN:  No Specimen  DISPOSITION OF SPECIMEN:  N/A  COUNTS:  YES  TOURNIQUET:  * No tourniquets in log *  DICTATION: .Other Dictation: Dictation Number [id number]  PLAN OF CARE: Admit to inpatient   PATIENT DISPOSITION:  PACU - hemodynamically stable.   Delay start of Pharmacological VTE agent (>24hrs) due to surgical blood loss or risk of bleeding: no

## 2017-12-04 NOTE — Discharge Instructions (Signed)
Minimal weight on the left leg please. WBAT on the right leg Ice to the left hip.  Follow up with Dr Veverly Fells in the office in two weeks, [phone]

## 2017-12-04 NOTE — Progress Notes (Addendum)
Pharmacy Antibiotic Note  Terry Harrell is a 84 y.o. 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 Patient has history of poor dentition, which could be possible source. Confirmed with microbiology lab that organism is susceptible to PCN. Pharmacy has been consulted for IV PCN G dosing. WBC down WNL, currently AF. TTE without evidence of endocarditis and repeat BCx are clear. Scr currently 1.00, appears near baseline. Estimated CrCl ~52 mL/min. Plans for L hip screw fixation today.  Plan: PCN G 18 million units/day via continuous IV infusion Planned LOT 2 weeks F/u clinical status and renal function  Height: 5\' 10"  (177.8 cm) Weight: 157 lb (71.2 kg) IBW/kg (Calculated) : 73  Temp (24hrs), Avg:98.6 F (37 C), Min:98.1 F (36.7 C), Max:99.1 F (37.3 C)  Recent Labs  Lab 11/29/17 1839 11/30/17 1415 11/30/17 1926 12/01/17 0444 12/01/17 0829 12/02/17 0325 12/03/17 0535  WBC 11.3*  --   --  19.9*  --  12.0* 7.6  CREATININE 0.81  --   --  1.32*  --  1.34* 1.00  LATICACIDVEN  --  2.1* 3.9*  --  1.6  --   --     Estimated Creatinine Clearance: 51.4 mL/min (by C-G formula based on SCr of 1 mg/dL).    Allergies  Allergen Reactions  . Aspirin Other (See Comments)    Per family aspirin causes him to bleed  . Fentanyl Rash   Antimicrobials this admission: Azithromycin 10/18 >> 10/21 Ceftriaxone 10/18 >> 10/21 Cefazolin 10/21 >> 10/22 PCN G 10/22 >>  Microbiology results: 10/17 MRSA PCR: negative 10/18 UCx: multiple species 17/00 BCx: Granulicatella adiacens 17/49 BCx: NG x2  Thank you for allowing pharmacy to be a part of this patient's care.  Mila Merry Gerarda Fraction, PharmD PGY2 Infectious Diseases Pharmacy Resident Phone: [phone] 12/04/2017 3:48 PM

## 2017-12-04 NOTE — Anesthesia Preprocedure Evaluation (Signed)
Anesthesia Evaluation  Patient identified by MRN, date of birth, ID band Patient awake    Reviewed: Allergy & Precautions, NPO status , Patient's Chart, lab work & pertinent test results  Airway Mallampati: II  TM Distance: >3 FB Neck ROM: Full    Dental  (+) Dental Advisory Given, Edentulous Upper, Edentulous Lower   Pulmonary former smoker,    Pulmonary exam normal breath sounds clear to auscultation       Cardiovascular negative cardio ROS Normal cardiovascular exam Rhythm:Regular Rate:Normal  Echo 12/04/17: Study Conclusions  - Left ventricle: The cavity size was normal. There was mild concentric hypertrophy. Systolic function was normal. The estimated ejection fraction was in the range of 55% to 60%. Although no diagnostic regional wall motion abnormality was identified, this possibility cannot be completely excluded on the basis of this study. - Mitral valve: Calcified annulus. - Left atrium: The atrium was mildly dilated.   Neuro/Psych PSYCHIATRIC DISORDERS Depression  Neuromuscular disease    GI/Hepatic Neg liver ROS, GERD  Medicated,  Endo/Other  diabetes, Type 2  Renal/GU negative Renal ROS     Musculoskeletal hip fracture   Abdominal   Peds  Hematology  (+) Blood dyscrasia (Thrombocytopenia), anemia ,   Anesthesia Other Findings Day of surgery medications reviewed with the patient.  Reproductive/Obstetrics                             Anesthesia Physical Anesthesia Plan  ASA: IV  Anesthesia Plan: General   Post-op Pain Management:    Induction: Intravenous  PONV Risk Score and Plan: 2 and Ondansetron and Dexamethasone  Airway Management Planned: Oral ETT  Additional Equipment:   Intra-op Plan:   Post-operative Plan: Extubation in OR  Informed Consent: I have reviewed the patients History and Physical, chart, labs and discussed the procedure including the risks,  benefits and alternatives for the proposed anesthesia with the patient or authorized representative who has indicated his/her understanding and acceptance.   Dental advisory given  Plan Discussed with: CRNA  Anesthesia Plan Comments: (Discussed partial DNR.  Patient's wishes involve no prolonged ventilatory support.  OK with temporary peri-operative ventilatory support if needed.)        Anesthesia Quick Evaluation

## 2017-12-05 ENCOUNTER — Inpatient Hospital Stay: Payer: Self-pay

## 2017-12-05 DIAGNOSIS — G500 Trigeminal neuralgia: Secondary | ICD-10-CM

## 2017-12-05 DIAGNOSIS — B370 Candidal stomatitis: Secondary | ICD-10-CM

## 2017-12-05 DIAGNOSIS — K225 Diverticulum of esophagus, acquired: Secondary | ICD-10-CM

## 2017-12-05 LAB — BASIC METABOLIC PANEL
ANION GAP: 7 (ref 5–15)
BUN: 29 mg/dL — ABNORMAL HIGH (ref 8–23)
CALCIUM: 7.9 mg/dL — AB (ref 8.9–10.3)
CHLORIDE: 117 mmol/L — AB (ref 98–111)
CO2: 18 mmol/L — AB (ref 22–32)
Creatinine, Ser: 0.99 mg/dL (ref 0.61–1.24)
GFR calc Af Amer: >60 mL/min (ref >60)
GFR calc non Af Amer: >60 mL/min (ref >60)
GLUCOSE: 200 mg/dL — AB (ref 70–99)
POTASSIUM: 4.3 mmol/L (ref 3.5–5.1)
Sodium: 142 mmol/L (ref 135–145)

## 2017-12-05 LAB — CBC WITH DIFFERENTIAL/PLATELET
Basophils Absolute: 0 10*3/uL (ref 0.0–0.1)
Basophils Relative: 0 %
EOS PCT: 0 %
Eosinophils Absolute: 0 10*3/uL (ref 0.0–0.5)
HEMATOCRIT: 32.6 % — AB (ref 39.0–52.0)
HEMOGLOBIN: 10.2 g/dL — AB (ref 13.0–17.0)
LYMPHS PCT: 22 %
Lymphs Abs: 2.2 10*3/uL (ref 0.7–4.0)
MCH: 36.2 pg — ABNORMAL HIGH (ref 26.0–34.0)
MCHC: 31.3 g/dL (ref 30.0–36.0)
MCV: 115.6 fL — AB (ref 80.0–100.0)
MONO ABS: 0.3 10*3/uL (ref 0.1–1.0)
MONOS PCT: 3 %
MYELOCYTES: 1 %
Metamyelocytes Relative: 1 %
Neutro Abs: 7.4 10*3/uL (ref 1.7–7.7)
Neutrophils Relative %: 73 %
Platelets: 144 10*3/uL — ABNORMAL LOW (ref 150–400)
RBC: 2.82 MIL/uL — AB (ref 4.22–5.81)
RDW: 14.7 % (ref 11.5–15.5)
WBC: 9.8 10*3/uL (ref 4.0–10.5)
nRBC: 0 % (ref 0.0–0.2)
nRBC: 0 /100 WBC

## 2017-12-05 LAB — GLUCOSE, CAPILLARY
GLUCOSE-CAPILLARY: 140 mg/dL — AB (ref 70–99)
GLUCOSE-CAPILLARY: 154 mg/dL — AB (ref 70–99)
Glucose-Capillary: 139 mg/dL — ABNORMAL HIGH (ref 70–99)
Glucose-Capillary: 177 mg/dL — ABNORMAL HIGH (ref 70–99)
Glucose-Capillary: 110 mg/dL — ABNORMAL HIGH (ref 70–99)

## 2017-12-05 LAB — CULTURE, BLOOD (ROUTINE X 2)

## 2017-12-05 LAB — CBC
HEMATOCRIT: 34.3 % — AB (ref 39.0–52.0)
Hemoglobin: 11 g/dL — ABNORMAL LOW (ref 13.0–17.0)
MCH: 36.4 pg — ABNORMAL HIGH (ref 26.0–34.0)
MCHC: 32.1 g/dL (ref 30.0–36.0)
MCV: 113.6 fL — ABNORMAL HIGH (ref 80.0–100.0)
NRBC: 0 % (ref 0.0–0.2)
Platelets: 168 10*3/uL (ref 150–400)
RBC: 3.02 MIL/uL — AB (ref 4.22–5.81)
RDW: 14.6 % (ref 11.5–15.5)
WBC: 9.8 10*3/uL (ref 4.0–10.5)

## 2017-12-05 MED ORDER — CARBAMAZEPINE 200 MG PO TABS
200.0000 mg | ORAL_TABLET | Freq: Two times a day (BID) | ORAL | Status: DC
  Administered 2017-12-05 – 2017-12-07 (×6): 200 mg via ORAL
  Filled 2017-12-05 (×6): qty 1

## 2017-12-05 NOTE — Progress Notes (Signed)
CSW met with patient's wife at bedside. She reports they would like a list of SNF facilities in the area. She reports they will tour the facilities with her daughter tomorrow and let CSW know of their top 3 choices.   CSW encouraged family to let CSW know of choices sooner, due to insurance authorization process. Family reports that is the soonest they are able to meet together to look at facilities is tomorrow during the day.   Family concerned about patient's lack of hearing and needing patience during therapy process as patient has trouble hearing and therefore following directions.  [place], [place]

## 2017-12-05 NOTE — Plan of Care (Signed)
  Problem: Pain Management: Goal: Pain level will decrease Outcome: Progressing   Problem: Safety: Goal: Ability to remain free from injury will improve Outcome: Progressing

## 2017-12-05 NOTE — Evaluation (Addendum)
Occupational Therapy Evaluation Patient Details Name: Terry Harrell MRN: [id number] DOB: 05/07/1927 Today's Date: 12/05/2017    History of Present Illness 84yo male c/o pain after fall at home, found to have sustained L subcapital femoral neck fracture. He received cannulated screw fixation L hip on 10/22. PMH acoustic neuroma, DM, neuropathy, spinal stenosis, hx brain surgery, HOH    Clinical Impression   Pt admitted with the above diagnoses and presents with below problem list. Pt will benefit from continued acute OT to address the below listed deficits and maximize independence with basic ADLs prior to d/c to next venue. PTA pt was using rw and some assist to mobilize. No family present during eval and pt with impaired communication and cognition. Pt with max difficulty following PWB precautions despite various attempts and +3 assist. Partial standing at EOB only this session due to pt being unable to follow WB precautions. Pt seems pleasantly confused and very talkative throughout session, smiling and even laughing at times despite speech being extremely difficulty to understand. Pt also appears to have 0/10 pain (FACES) so lacks internal physical cues to guard LLE. No family present during eval.       Follow Up Recommendations  SNF    Equipment Recommendations  Other (comment)(defer to next venue)    Recommendations for Other Services       Precautions / Restrictions Precautions Precautions: Fall Precaution Comments: impaired cognition and communication; max difficulty with PWB  Restrictions Weight Bearing Restrictions: Yes LLE Weight Bearing: Partial weight bearing LLE Partial Weight Bearing Percentage or Pounds: 25%       Mobility Bed Mobility Overal bed mobility: Needs Assistance Bed Mobility: Supine to Sit;Sit to Supine     Supine to sit: Min assist;+2 for physical assistance Sit to supine: Mod assist;+2 for physical assistance   General bed mobility comments: assist  given to bring legs to EOB and maintain balance when sitting upright initially   Transfers Overall transfer level: Needs assistance Equipment used: Rolling walker (2 wheeled) Transfers: Sit to/from Stand Sit to Stand: From elevated surface;Max assist;+2 physical assistance;+2 safety/equipment         General transfer comment: initially attempted sit to stand from elevated surface with MaxA to attempt to keep weight off of L LE and MaxA for boost, unable to stand. Attempted again in [place] with +3 assist, able to come to full stand in [place] with MaxA+1 to control L LE and MaxAx2 to come to stand. Unable to maintain weightbearing precautions even with maxA on L LE.     Balance Overall balance assessment: Needs assistance Sitting-balance support: Feet supported;Bilateral upper extremity supported Sitting balance-Leahy Scale: Poor   Postural control: Posterior lean   Standing balance-Leahy Scale: Zero(could not stand and maintain WB precautions even with +3 hel)                             ADL either performed or assessed with clinical judgement   ADL Overall ADL's : Needs assistance/impaired Eating/Feeding: Set up;Bed level   Grooming: Set up;Bed level;Moderate assistance;Sitting Grooming Details (indicate cue type and reason): assist for balance and perhaps full task completion due to cognition Upper Body Bathing: Moderate assistance Upper Body Bathing Details (indicate cue type and reason): assist for balance and perhaps full task completion due to cognition Lower Body Bathing: Maximal assistance;+2 for physical assistance;Sitting/lateral leans Lower Body Bathing Details (indicate cue type and reason): +3 Upper Body Dressing : Moderate assistance;Sitting Upper  Body Dressing Details (indicate cue type and reason): assist for balance and perhaps full task completion due to cognition Lower Body Dressing: Maximal assistance;+2 for physical assistance;Sitting/lateral  leans Lower Body Dressing Details (indicate cue type and reason): +3               General ADL Comments: Pt completed bed mobility and attempted unsuccessfully to stand with pt unable to follow PWB precautions.      Vision   Additional Comments: No glasses in room. Pt could read short messages if in very large font size.     Perception     Praxis      Pertinent Vitals/Pain Pain Assessment: Faces Pain Score: 0-No pain Faces Pain Scale: No hurt Pain Intervention(s): Limited activity within patient's tolerance;Monitored during session     Hand Dominance     Extremity/Trunk Assessment Upper Extremity Assessment Upper Extremity Assessment: Generalized weakness   Lower Extremity Assessment Lower Extremity Assessment: Defer to PT evaluation   Cervical / Trunk Assessment Cervical / Trunk Assessment: Kyphotic   Communication Communication Communication: HOH   Cognition Arousal/Alertness: Awake/alert Behavior During Therapy: Impulsive Overall Cognitive Status: No family/caregiver present to determine baseline cognitive functioning Area of Impairment: Orientation;Attention;Memory;Following commands;Safety/judgement;Awareness;Problem solving                 Orientation Level: Disoriented to;Place;Time;Situation Current Attention Level: Sustained Memory: Decreased recall of precautions;Decreased short-term memory Following Commands: Follows one step commands consistently;Follows multi-step commands inconsistently Safety/Judgement: Decreased awareness of safety;Decreased awareness of deficits Awareness: Intellectual Problem Solving: Slow processing;Difficulty sequencing;Requires verbal cues;Requires tactile cues General Comments: Pt very talkative, smiling, laughing occasionally despite speech being extremely difficulty to understand. Not following PWB precautions.    General Comments       Exercises     Shoulder Instructions      Home Living Family/patient  expects to be discharged to:: Private residence Living Arrangements: Spouse/significant other Available Help at Discharge: Family;Available 24 hours/day Type of Home: House Home Access: Ramped entrance     Home Layout: One level     Bathroom Shower/Tub: Teacher, early years/pre: Standard     Home Equipment: Environmental consultant - 2 wheels;Bedside commode;Tub bench;Hand held shower head;Hospital bed;Other (comment)   Additional Comments: no family present, all information taken from prior PT charting from March 2019      Prior Functioning/Environment Level of Independence: Needs assistance  Gait / Transfers Assistance Needed: Per H&P: Wife stated she normally assists patient during ambulation    Communication / Swallowing Assistance Needed: HOH- R ear is best          OT Problem List: Decreased strength;Impaired balance (sitting and/or standing);Decreased activity tolerance;Decreased cognition;Decreased safety awareness;Decreased knowledge of use of DME or AE;Decreased knowledge of precautions;Cardiopulmonary status limiting activity;Pain      OT Treatment/Interventions: Self-care/ADL training;DME and/or AE instruction;Therapeutic activities;Balance training;Patient/family education;Cognitive remediation/compensation    OT Goals(Current goals can be found in the care plan section) Acute Rehab OT Goals OT Goal Formulation: Patient unable to participate in goal setting Time For Goal Achievement: 12/19/17 Potential to Achieve Goals: Fair ADL Goals Pt Will Perform Lower Body Bathing: with max assist;sit to/from stand;sitting/lateral leans Pt Will Perform Lower Body Dressing: with max assist;sitting/lateral leans;sit to/from stand Pt Will Transfer to Toilet: with min assist;with +2 assist;stand pivot transfer;bedside commode Pt Will Perform Toileting - Clothing Manipulation and hygiene: with max assist;sit to/from stand;sitting/lateral leans Additional ADL Goal #1: Pt will complete  bed mobility at min A level to prepare for OOB  ADLs.  OT Frequency: Min 2X/week   Barriers to D/C:            Co-evaluation PT/OT/SLP Co-Evaluation/Treatment: Yes Reason for Co-Treatment: Complexity of the patient's impairments (multi-system involvement);Necessary to address cognition/behavior during functional activity;For patient/therapist safety;To address functional/ADL transfers PT goals addressed during session: Mobility/safety with mobility;Balance;Proper use of DME;Strengthening/ROM OT goals addressed during session: ADL's and self-care      AM-PAC PT "6 Clicks" Daily Activity     Outcome Measure Help from another person eating meals?: None Help from another person taking care of personal grooming?: A Lot Help from another person toileting, which includes using toliet, bedpan, or urinal?: Total Help from another person bathing (including washing, rinsing, drying)?: Total Help from another person to put on and taking off regular upper body clothing?: A Lot Help from another person to put on and taking off regular lower body clothing?: Total 6 Click Score: 11   End of Session Equipment Utilized During Treatment: Gait belt;Rolling walker;Oxygen Nurse Communication: Mobility status;Other (comment);Precautions(unable to follow PWB, No SCDs in room)  Activity Tolerance: Patient tolerated treatment well;Other (comment)(O2 dropped to 87 at times during session while on supplement) Patient left: in bed;with call bell/phone within reach;with bed alarm set  OT Visit Diagnosis: Unsteadiness on feet (R26.81);Other abnormalities of gait and mobility (R26.89);Muscle weakness (generalized) (M62.81);Pain                Time: 6659-9357 OT Time Calculation (min): 45 min Charges:  OT General Charges $OT Visit: 1 Visit OT Evaluation $OT Eval Moderate Complexity: [place], OT Acute Rehabilitation Services Pager: [phone] Office: [phone]   Hortencia Pilar 12/05/2017, 11:00 AM

## 2017-12-05 NOTE — Evaluation (Signed)
Physical Therapy Evaluation Patient Details Name: Terry Harrell MRN: [id number] DOB: March 13, 1927 Today's Date: 12/05/2017   History of Present Illness  84yo male c/o pain after fall at home, found to have sustained L subcapital femoral neck fracture. He received cannulated screw fixation L hip on 10/22. PMH acoustic neuroma, DM, neuropathy, spinal stenosis, hx brain surgery, HOH   Clinical Impression  Patient received in bed, appears pleasantly confused, PT/OT with difficulty in understanding what he is saying due to garbled/mumbling speech but able to read simple written cues and follow simple commands. Very HOH per chart review. He requires MinAx2 to come to EOB and demonstrates posterior lean, able to correct with VC and visual cues but no carryover and requires repeated cues for upright during session. Attempted sit to stand with RW with MaxAx1 to boost and MaxAx1 to guard/maintain WB precautions on L LE but unable to perform transfer safely. Next attempted in [place] with MaxAx1 to attempt to maintain WB precautions on L LE and MaxAx2 for boost to stand in [place]; able to come to stand but unable to maintain WB precautions. He was returned to supine with ModAx2, and left in bed with all needs met and bed alarm active. No family present to determine baseline level of function or cognitive status. He will continue to benefit from skilled PT services in the acute setting as well as ongoing services in the ST-SNF setting moving forward.     Follow Up Recommendations SNF    Equipment Recommendations  Other (comment)(defer to next venue )    Recommendations for Other Services       Precautions / Restrictions Precautions Precautions: Fall Restrictions Weight Bearing Restrictions: Yes LLE Weight Bearing: Partial weight bearing LLE Partial Weight Bearing Percentage or Pounds: 25%       Mobility  Bed Mobility Overal bed mobility: Needs Assistance Bed Mobility: Supine to Sit;Sit to Supine      Supine to sit: Min assist;+2 for physical assistance Sit to supine: Mod assist;+2 for physical assistance   General bed mobility comments: assist given to bring legs to EOB and maintain balance when sitting upright initially   Transfers Overall transfer level: Needs assistance Equipment used: Rolling walker (2 wheeled) Transfers: Sit to/from Stand Sit to Stand: From elevated surface;Max assist;+2 physical assistance;+2 safety/equipment         General transfer comment: initially attempted sit to stand from elevated surface with MaxA to attempt to keep weight off of L LE and MaxA for boost, unable to stand. Attempted again in [place] with +3 assist, able to come to full stand in [place] with MaxA+1 to control L LE and MaxAx2 to come to stand. Unable to maintain weightbearing precautions even with maxA on L LE.   Ambulation/Gait             General Gait Details: unsafe to attempt   Stairs            Wheelchair Mobility    Modified Rankin (Stroke Patients Only)       Balance Overall balance assessment: Needs assistance Sitting-balance support: Feet supported;Bilateral upper extremity supported Sitting balance-Leahy Scale: Poor   Postural control: Posterior lean   Standing balance-Leahy Scale: Poor                               Pertinent Vitals/Pain Pain Assessment: Faces Pain Score: 0-No pain Faces Pain Scale: No hurt Pain Intervention(s): Limited activity within patient's tolerance;Monitored during  session    Home Living Family/patient expects to be discharged to:: Private residence Living Arrangements: Spouse/significant other Available Help at Discharge: Family;Available 24 hours/day(daughter retired and stays at home to help mom and dad ) Type of Home: House Home Access: [place]: One [place]: Environmental consultant - 2 wheels;Bedside commode;Tub bench;Hand held shower head;Hospital bed;Other (comment) Additional  Comments: no family present, all information taken from prior PT charting from March 2019    Prior Function Level of Independence: Needs assistance   Gait / Transfers Assistance Needed: walked around the home with RW essentially unassisted.  Wife reported she has to help him off of the commode at times.            Hand Dominance        Extremity/Trunk Assessment   Upper Extremity Assessment Upper Extremity Assessment: Defer to OT evaluation    Lower Extremity Assessment Lower Extremity Assessment: Generalized weakness    Cervical / Trunk Assessment Cervical / Trunk Assessment: Kyphotic  Communication   Communication: HOH  Cognition Arousal/Alertness: Awake/alert Behavior During Therapy: Impulsive Overall Cognitive Status: Impaired/Different from baseline Area of Impairment: Orientation;Attention;Memory;Following commands;Safety/judgement;Awareness;Problem solving                 Orientation Level: Disoriented to;Place;Time;Situation Current Attention Level: Sustained Memory: Decreased recall of precautions;Decreased short-term memory Following Commands: Follows one step commands consistently;Follows one step commands with increased time Safety/Judgement: Decreased awareness of safety;Decreased awareness of deficits Awareness: Intellectual Problem Solving: Slow processing;Difficulty sequencing;Requires verbal cues;Requires tactile cues        General Comments      Exercises     Assessment/Plan    PT Assessment Patient needs continued PT services  PT Problem List Decreased strength;Decreased cognition;Decreased knowledge of use of DME;Decreased activity tolerance;Decreased safety awareness;Decreased balance;Decreased knowledge of precautions;Decreased mobility;Decreased coordination       PT Treatment Interventions DME instruction;Balance training;Gait training;Neuromuscular re-education;Stair training;Functional mobility training;Patient/family  education;Therapeutic activities;Therapeutic exercise;Manual techniques    PT Goals (Current goals can be found in the Care Plan section)  Acute Rehab PT Goals PT Goal Formulation: Patient unable to participate in goal setting    Frequency Min 2X/week   Barriers to discharge        Co-evaluation PT/OT/SLP Co-Evaluation/Treatment: Yes Reason for Co-Treatment: Complexity of the patient's impairments (multi-system involvement);Necessary to address cognition/behavior during functional activity;For patient/therapist safety;To address functional/ADL transfers PT goals addressed during session: Mobility/safety with mobility;Balance;Proper use of DME;Strengthening/ROM         AM-PAC PT "6 Clicks" Daily Activity  Outcome Measure Difficulty turning over in bed (including adjusting bedclothes, sheets and blankets)?: Unable Difficulty moving from lying on back to sitting on the side of the bed? : Unable Difficulty sitting down on and standing up from a chair with arms (e.g., wheelchair, bedside commode, etc,.)?: Unable Help needed moving to and from a bed to chair (including a wheelchair)?: Total Help needed walking in hospital room?: Total Help needed climbing 3-5 steps with a railing? : Total 6 Click Score: 6    End of Session Equipment Utilized During Treatment: Gait belt Activity Tolerance: Patient tolerated treatment well Patient left: in bed;with call bell/phone within reach;with bed alarm set Nurse Communication: Mobility status;Precautions;Weight bearing status PT Visit Diagnosis: Unsteadiness on feet (R26.81);Difficulty in walking, not elsewhere classified (R26.2);Muscle weakness (generalized) (M62.81);History of falling (Z91.81)    Time: 8315-1761 PT Time Calculation (min) (ACUTE ONLY): 45 min   Charges:   PT Evaluation $PT Eval High Complexity:  1 High PT Treatments $Therapeutic Activity: 8-22 mins        Deniece Ree PT, DPT, CBIS  Supplemental Physical  Therapist [place]    Pager [phone] Acute Rehab Office [phone]

## 2017-12-05 NOTE — Progress Notes (Signed)
[facility]  Date of Admission:  11/29/2017     Total days of antibiotics 6         ASSESSMENT/PLAN  Terry Harrell is on Day 6 of antimicrobial therapy for Staphylococcus Capitis/Granulicatella bacteremia discovered following a fracture of the left hip. Now POD 1 from screw fixation of the left hip and appears to be doing well. He has remained afebrile and surgical dressing is clean and dry. Repeat blood cultures with no growth to date. Overall he appears to be doing well. Family expresses concern regarding his trigeminal neuralgia flaring up.  1. Continue current dose of penicillin. Will need PICC line placed for IV therapy with about 2 weeks in duration with tentative end date of 11/3.  2. Monitor cultures.  Principal Problem:   Closed left hip fracture, initial encounter [facility]) Active Problems:   Fall   GERD (gastroesophageal reflux disease)   Diabetes mellitus without complication (HCC)   Trigeminal neuralgia   BPH (benign prostatic hyperplasia)   Closed left hip fracture ([place])   . carbamazepine  200 mg Oral BID  . docusate sodium  100 mg Oral BID  . enoxaparin (LOVENOX) injection  40 mg Subcutaneous Q24H  . gabapentin  300 mg Oral QHS  . insulin aspart  0-9 Units Subcutaneous TID WC  . Melatonin  3 mg Oral QHS  . pantoprazole  40 mg Oral Daily  . psyllium  1 packet Oral Daily  . senna  1 tablet Oral BID  . tamsulosin  0.4 mg Oral Daily  . vitamin B-12  1,000 mcg Oral Daily    SUBJECTIVE:  Afebrile overnight. No complaints.  Allergies  Allergen Reactions  . Aspirin Other (See Comments)    Per family aspirin causes him to bleed  . Fentanyl Rash     Review of Systems: Review of Systems  Constitutional: Negative for chills and fever.  Respiratory: Negative for cough, shortness of breath and wheezing.   Cardiovascular: Negative for chest pain.  Gastrointestinal: Negative for abdominal pain, constipation, diarrhea, nausea and vomiting.    Genitourinary: Negative for urgency.  Skin: Negative for rash.      OBJECTIVE: Vitals:   12/05/17 0017 12/05/17 0449 12/05/17 0845 12/05/17 1123  BP: 100/64 109/70 118/66 110/69  Pulse: 61 (!) 53 (!) 51 (!) 51  Resp: 18 18 20 20   Temp: 97.8 F (36.6 C) 98.3 F (36.8 C) 97.9 F (36.6 C) 97.8 F (36.6 C)  TempSrc: Oral Oral Oral Oral  SpO2: 92% 93% 94% 97%  Weight:      Height:       Body mass index is 22.53 kg/m.  Physical Exam  Constitutional: He is oriented to person, place, and time. He appears well-developed and well-nourished. No distress.  Lying in bed with head of bed elevated; pleasant; telling jokes  Cardiovascular: Normal rate, regular rhythm, normal heart sounds and intact distal pulses.  Pulmonary/Chest: Effort normal and breath sounds normal.  Neurological: He is alert and oriented to person, place, and time.  Skin: Skin is warm and dry.  Psychiatric: He has a normal mood and affect. His behavior is normal. Judgment and thought content normal.    Lab Results Lab Results  Component Value Date   WBC 9.8 12/05/2017   HGB 10.2 (L) 12/05/2017   HCT 32.6 (L) 12/05/2017   MCV 115.6 (H) 12/05/2017   PLT 144 (L) 12/05/2017    Lab Results  Component Value Date   CREATININE 0.99 12/05/2017  BUN 29 (H) 12/05/2017   NA 142 12/05/2017   K 4.3 12/05/2017   CL 117 (H) 12/05/2017   CO2 18 (L) 12/05/2017    Lab Results  Component Value Date   ALT 42 12/01/2017   AST 27 12/01/2017   ALKPHOS 91 12/01/2017   BILITOT 1.1 12/01/2017     Microbiology: Recent Results (from the past 240 hour(s))  MRSA PCR Screening     Status: None   Collection Time: 11/29/17 11:55 PM  Result Value Ref Range Status   MRSA by PCR NEGATIVE NEGATIVE Final    Comment:        The GeneXpert MRSA Assay (FDA approved for NASAL specimens only), is one component of a comprehensive MRSA colonization surveillance program. It is not intended to diagnose MRSA infection nor to guide  or monitor treatment for MRSA infections. Performed at [facility], [place] [address], [place], [place] [address]   Urine Culture     Status: Abnormal   Collection Time: 11/30/17  1:57 PM  Result Value Ref Range Status   Specimen Description URINE, CATHETERIZED  Final   Special Requests   Final    NONE Performed at [facility], [place] [address], [place], [place] [address]    Culture MULTIPLE SPECIES PRESENT, SUGGEST RECOLLECTION (A)  Final   Report Status 12/01/2017 FINAL  Final  Culture, blood (routine x 2)     Status: Abnormal   Collection Time: 11/30/17  2:14 PM  Result Value Ref Range Status   Specimen Description BLOOD BLOOD RIGHT FOREARM  Final   Special Requests   Final    BOTTLES DRAWN AEROBIC AND ANAEROBIC Blood Culture results may not be optimal due to an excessive volume of blood received in culture bottles   Culture  Setup Time   Final    GRAM POSITIVE COCCI IN CLUSTERS IN BOTH AEROBIC AND ANAEROBIC BOTTLES CRITICAL RESULT CALLED TO, READ BACK BY AND VERIFIED WITH: L.SEAY,PHARMD AT 2951 ON 12/02/17 BY G.[email or web address] Performed at [facility], [place] [address] [place], [place] [address]    Culture (A)  Final    GRANULICATELLA ADIACENS SUSCEPTIBILITIES PERFORMED ON PREVIOUS CULTURE WITHIN THE LAST 5 DAYS. STAPHYLOCOCCUS CAPITIS    Report Status 12/05/2017 FINAL  Final   Organism ID, Bacteria STAPHYLOCOCCUS CAPITIS  Final      Susceptibility   Staphylococcus capitis - MIC*    CIPROFLOXACIN 1 SENSITIVE Sensitive     ERYTHROMYCIN >=8 RESISTANT Resistant     GENTAMICIN <=0.5 SENSITIVE Sensitive     OXACILLIN <=0.25 SENSITIVE Sensitive     TETRACYCLINE <=1 SENSITIVE Sensitive     VANCOMYCIN <=0.5 SENSITIVE Sensitive     TRIMETH/SULFA <=10 SENSITIVE Sensitive     CLINDAMYCIN RESISTANT Resistant     RIFAMPIN <=0.5 SENSITIVE Sensitive     Inducible Clindamycin POSITIVE Resistant     * STAPHYLOCOCCUS CAPITIS  Blood Culture ID Panel (Reflexed)     Status:  Abnormal   Collection Time: 11/30/17  2:14 PM  Result Value Ref Range Status   Enterococcus species NOT DETECTED NOT DETECTED Final   Listeria monocytogenes NOT DETECTED NOT DETECTED Final   Staphylococcus species DETECTED (A) NOT DETECTED Final    Comment: Methicillin (oxacillin) susceptible coagulase negative staphylococcus. Possible blood culture contaminant (unless isolated from more than one blood culture draw or clinical case suggests pathogenicity). No antibiotic treatment is indicated for blood  culture contaminants. CRITICAL RESULT CALLED TO, READ BACK BY AND VERIFIED WITH: L.SEAY,  PHARMD AT 6237 ON 12/02/17 BY G.MCADOO    Staphylococcus aureus (BCID) NOT DETECTED NOT DETECTED Final   Methicillin resistance NOT DETECTED NOT DETECTED Final   Streptococcus species NOT DETECTED NOT DETECTED Final   Streptococcus agalactiae NOT DETECTED NOT DETECTED Final   Streptococcus pneumoniae NOT DETECTED NOT DETECTED Final   Streptococcus pyogenes NOT DETECTED NOT DETECTED Final   Acinetobacter baumannii NOT DETECTED NOT DETECTED Final   Enterobacteriaceae species NOT DETECTED NOT DETECTED Final   Enterobacter cloacae complex NOT DETECTED NOT DETECTED Final   Escherichia coli NOT DETECTED NOT DETECTED Final   Klebsiella oxytoca NOT DETECTED NOT DETECTED Final   Klebsiella pneumoniae NOT DETECTED NOT DETECTED Final   Proteus species NOT DETECTED NOT DETECTED Final   Serratia marcescens NOT DETECTED NOT DETECTED Final   Haemophilus influenzae NOT DETECTED NOT DETECTED Final   Neisseria meningitidis NOT DETECTED NOT DETECTED Final   Pseudomonas aeruginosa NOT DETECTED NOT DETECTED Final   Candida albicans NOT DETECTED NOT DETECTED Final   Candida glabrata NOT DETECTED NOT DETECTED Final   Candida krusei NOT DETECTED NOT DETECTED Final   Candida parapsilosis NOT DETECTED NOT DETECTED Final   Candida tropicalis NOT DETECTED NOT DETECTED Final    Comment: Performed at [facility],  [place] [address], [place], [place] [address]  Culture, blood (routine x 2)     Status: Abnormal   Collection Time: 11/30/17  2:19 PM  Result Value Ref Range Status   Specimen Description BLOOD BLOOD LEFT ARM  Final   Special Requests   Final    BOTTLES DRAWN AEROBIC AND ANAEROBIC Blood Culture adequate volume   Culture  Setup Time   Final    GRAM POSITIVE COCCI IN CLUSTERS IN BOTH AEROBIC AND ANAEROBIC BOTTLES CRITICAL VALUE NOTED.  VALUE IS CONSISTENT WITH PREVIOUSLY REPORTED AND CALLED VALUE.    Culture GRANULICATELLA ADIACENS (A)  Final   Report Status 12/04/2017 FINAL  Final   Organism ID, Bacteria GRANULICATELLA ADIACENS  Final      Susceptibility   Granulicatella adiacens - MIC*    CEFTRIAXONE 0.5 SENSITIVE Sensitive     ERYTHROMYCIN <=0.12 SENSITIVE Sensitive     LEVOFLOXACIN <=0.25 SENSITIVE Sensitive     VANCOMYCIN Value in next row Sensitive      0.25 SENSITIVEPerformed at [facility] [address] [place], [place] [address]    * GRANULICATELLA ADIACENS  Culture, blood (routine x 2)     Status: None (Preliminary result)   Collection Time: 12/02/17  3:33 PM  Result Value Ref Range Status   Specimen Description BLOOD LEFT ANTECUBITAL  Final   Special Requests   Final    BOTTLES DRAWN AEROBIC AND ANAEROBIC Blood Culture adequate volume   Culture   Final    NO GROWTH 3 DAYS Performed at [facility], [place] [address] [place], [place] [address]    Report Status PENDING  Incomplete  Culture, blood (routine x 2)     Status: None (Preliminary result)   Collection Time: 12/02/17  3:39 PM  Result Value Ref Range Status   Specimen Description BLOOD LEFT ARM  Final   Special Requests   Final    BOTTLES DRAWN AEROBIC AND ANAEROBIC Blood Culture adequate volume   Culture   Final    NO GROWTH 3 DAYS Performed at [facility], [address], [place], [place] [address]    Report Status PENDING  Incomplete     Terri Piedra, NP  [facility] [phone] Pager  12/05/2017  4:22 PM

## 2017-12-05 NOTE — Progress Notes (Signed)
Present to place PICC.  Wife; Alquan Morrish states was not informed of the plan to place PICC.  States has received conflicting information on the plan of care and would like to discuss the PICC placement with the MD before proceeding.  She states will be in tomorrow at about 10:00am.  Will follow up tomorrow for PICC placement.

## 2017-12-05 NOTE — Progress Notes (Signed)
   Subjective: 1 Day Post-Op Procedure(s) (LRB): PERCUTANEOUS CANNULATED SCREW FIXATION LEFT HIP (Left)  Recheck left hip s/p pinning yesterday Denies any pain in the left hip today Still c/o mild to moderate pain to facial area Patient reports pain as moderate. In facial area  Objective:   VITALS:   Vitals:   12/05/17 0449 12/05/17 0845  BP: 109/70 118/66  Pulse: (!) 53 (!) 51  Resp: 18 20  Temp: 98.3 F (36.8 C) 97.9 F (36.6 C)  SpO2: 93% 94%    Left hip incision healing well No drainage nv intact distally No rashes or edema distally  LABS Recent Labs    12/03/17 0535 12/04/17 2154 12/05/17 0200  HGB 9.4* 11.0* 10.2*  HCT 28.9* 34.3* 32.6*  WBC 7.6 9.8 9.8  PLT 132* 168 144*    Recent Labs    12/03/17 0535 12/04/17 2154 12/05/17 0200  NA 142  --  142  K 3.8  --  4.3  BUN 36*  --  29*  CREATININE 1.00 0.97 0.99  GLUCOSE 139*  --  200*     Assessment/Plan: 1 Day Post-Op Procedure(s) (LRB): PERCUTANEOUS CANNULATED SCREW FIXATION LEFT HIP (Left) Continue PT/OT Will continue to monitor his progress Pain management  D/c planning   Brad Luna [place], [place] is now [facility]  [place] [address], [place], [place], [place] [address] Phone: [phone] [email or web address] Facebook  [employer]

## 2017-12-05 NOTE — Progress Notes (Signed)
PROGRESS NOTE    Terry Harrell  TKW:[id number] DOB: 1927/09/14 DOA: 11/29/2017 PCP: Drake Leach, MD   Brief Narrative: Terry Harrell a 84 y.o.malewith medical history significant foracoustic neuroma, trigeminal neuralgia, diabetes, GERD presents to the ED complaining of left hip pain after mechanical fall at home. History provided by wife as patient is extremely hard of hearing. Wife stated she normally assists patient during ambulation,but while she was in the bathroom, patient decided to ambulate unassisted,resulting to him falling on his side, denieshitting his head or losing consciousness. Patient was okay ambulating for about an hour after the fall,later complained of significant left hip pain and was unable to ambulate, hencebringing him to the ED. Patient denies any chest pain, shortness of breath, palpitations, dizziness, abdominal pain, nausea/vomiting, diarrhea, cough, fever/chills. Wife stated patient is very prone to falling if he gets up unassisted. In the ED, CT left hip showed acute subcapital left femoral neck fracture. Orthopedics consulted. Patient admitted for further management.   Assessment & Plan:   Principal Problem:   Closed left hip fracture, initial encounter [facility] Active Problems:   Fall   GERD (gastroesophageal reflux disease)   Diabetes mellitus without complication (HCC)   Trigeminal neuralgia   BPH (benign prostatic hyperplasia)   Closed left hip fracture (HCC)   1-Sepsis, secondary to Granulicatella adiacens bacteremia. Related to dentition vs PNA.  Afebrile.  BC with 4/4 bottle growing granulicatella adiacens repeat BC X 2 NGTD ID recommends 2 weeks of IV antibiotics and placement of picc line.  On penicillin.   2-AKI; resolved with fluids.   Elevated D dimer;  VQ scan negative,.   Anemia; macrocytic anemia; iron deficiency.  baseline 12.  Low iron. Received IV iron.   Acute subcapital left femoral neck fracture.  Underwent sx  10-22.  DM  SSI.   Trigeminal neuralgia;  Patient denies pain today. He was eating lunch.  IV dose phenytoin help.  Resume home dose tegretol, per wife dose is 200 mg BID.   BPH Continue Flomax  Hard of hearing.    DVT prophylaxis: lovenox Code Status:full code. Was previously partial code. Per wife patient can be intubated short term.  Family Communication: wife at bedside,.  Disposition Plan:needs SNF  Consultants:  Ortho ID   Procedures:  PERCUTANEOUS CANNULATED SCREW FIXATION LEFT HIP 10-22   Antimicrobials:   Penicillin.    Subjective: He is alert, eating spaghettis. Denies jaw pain   Objective: Vitals:   12/05/17 0017 12/05/17 0449 12/05/17 0845 12/05/17 1123  BP: 100/64 109/70 118/66 110/69  Pulse: 61 (!) 53 (!) 51 (!) 51  Resp: 18 18 20 20   Temp: 97.8 F (36.6 C) 98.3 F (36.8 C) 97.9 F (36.6 C) 97.8 F (36.6 C)  TempSrc: Oral Oral Oral Oral  SpO2: 92% 93% 94% 97%  Weight:      Height:        Intake/Output Summary (Last 24 hours) at 12/05/2017 1641 Last data filed at 12/05/2017 1027 Gross per 24 hour  Intake 50 ml  Output 575 ml  Net -525 ml   Filed Weights   11/29/17 1532  Weight: 71.2 kg    Examination:  General exam: Appears calm and comfortable  Respiratory system: Clear to auscultation. Respiratory effort normal. Cardiovascular system: S1 & S2 heard, RRR. No JVD, murmurs, rubs, gallops or clicks. No pedal edema. Gastrointestinal system: Abdomen is nondistended, soft and nontender. No organomegaly or masses felt. Normal bowel sounds heard. Central nervous system: Alert and oriented.  Extremities: Symmetric  5 x 5 power. Skin: No rashes, lesions or ulcers    Data Reviewed: I have personally reviewed following labs and imaging studies  CBC: Recent Labs  Lab 11/29/17 1839 12/01/17 0444 12/02/17 0325 12/03/17 0535 12/04/17 2154 12/05/17 0200  WBC 11.3* 19.9* 12.0* 7.6 9.8 9.8  NEUTROABS 9.2*  --  10.5* 5.1  --  7.4    HGB 12.2* 10.0* 9.7* 9.4* 11.0* 10.2*  HCT 36.6* 29.6* 29.2* 28.9* 34.3* 32.6*  MCV 112.3* 112.1* 113.6* 115.1* 113.6* 115.6*  PLT 209 157 142* 132* 168 948*   Basic Metabolic Panel: Recent Labs  Lab 11/29/17 1839 12/01/17 0444 12/02/17 0325 12/03/17 0535 12/04/17 2154 12/05/17 0200  NA 136 138 140 142  --  142  K 4.3 4.3 3.8 3.8  --  4.3  CL 107 112* 112* 116*  --  117*  CO2 20* 19* 21* 21*  --  18*  GLUCOSE 118* 171* 169* 139*  --  200*  BUN 25* 32* 38* 36*  --  29*  CREATININE 0.81 1.32* 1.34* 1.00 0.97 0.99  CALCIUM 8.9 8.0* 7.8* 7.6*  --  7.9*   GFR: Estimated Creatinine Clearance: 51.9 mL/min (by C-G formula based on SCr of 0.99 mg/dL). Liver Function Tests: Recent Labs  Lab 12/01/17 0829  AST 27  ALT 42  ALKPHOS 91  BILITOT 1.1  PROT 5.6*  ALBUMIN 2.6*   No results for input(s): LIPASE, AMYLASE in the last 168 hours. No results for input(s): AMMONIA in the last 168 hours. Coagulation Profile: Recent Labs  Lab 11/29/17 1839  INR 1.07   Cardiac Enzymes: Recent Labs  Lab 12/01/17 0829  CKTOTAL 66   BNP (last 3 results) No results for input(s): PROBNP in the last 8760 hours. HbA1C: No results for input(s): HGBA1C in the last 72 hours. CBG: Recent Labs  Lab 12/04/17 2135 12/05/17 0622 12/05/17 0817 12/05/17 1120 12/05/17 1625  GLUCAP 143* 139* 140* 177* 110*   Lipid Profile: No results for input(s): CHOL, HDL, LDLCALC, TRIG, CHOLHDL, LDLDIRECT in the last 72 hours. Thyroid Function Tests: No results for input(s): TSH, T4TOTAL, FREET4, T3FREE, THYROIDAB in the last 72 hours. Anemia Panel: No results for input(s): VITAMINB12, FOLATE, FERRITIN, TIBC, IRON, RETICCTPCT in the last 72 hours. Sepsis Labs: Recent Labs  Lab 11/30/17 1415 11/30/17 1926 12/01/17 0829 12/01/17 0838 12/02/17 0325 12/03/17 0535  PROCALCITON  --   --   --  14.29 13.06 5.66  LATICACIDVEN 2.1* 3.9* 1.6  --   --   --     Recent Results (from the past 240 hour(s))   MRSA PCR Screening     Status: None   Collection Time: 11/29/17 11:55 PM  Result Value Ref Range Status   MRSA by PCR NEGATIVE NEGATIVE Final    Comment:        The GeneXpert MRSA Assay (FDA approved for NASAL specimens only), is one component of a comprehensive MRSA colonization surveillance program. It is not intended to diagnose MRSA infection nor to guide or monitor treatment for MRSA infections. Performed at [facility], [place] [address], [place], [place] [address]   Urine Culture     Status: Abnormal   Collection Time: 11/30/17  1:57 PM  Result Value Ref Range Status   Specimen Description URINE, CATHETERIZED  Final   Special Requests   Final    NONE Performed at [facility], [place] [address] [place], [place] [address]    Culture MULTIPLE SPECIES PRESENT,  SUGGEST RECOLLECTION (A)  Final   Report Status 12/01/2017 FINAL  Final  Culture, blood (routine x 2)     Status: Abnormal   Collection Time: 11/30/17  2:14 PM  Result Value Ref Range Status   Specimen Description BLOOD BLOOD RIGHT FOREARM  Final   Special Requests   Final    BOTTLES DRAWN AEROBIC AND ANAEROBIC Blood Culture results may not be optimal due to an excessive volume of blood received in culture bottles   Culture  Setup Time   Final    GRAM POSITIVE COCCI IN CLUSTERS IN BOTH AEROBIC AND ANAEROBIC BOTTLES CRITICAL RESULT CALLED TO, READ BACK BY AND VERIFIED WITH: L.SEAY,PHARMD AT 3710 ON 12/02/17 BY G.[email or web address] Performed at [facility], [place] [address], [place], [place] [address]    Culture (A)  Final    GRANULICATELLA ADIACENS SUSCEPTIBILITIES PERFORMED ON PREVIOUS CULTURE WITHIN THE LAST 5 DAYS. STAPHYLOCOCCUS CAPITIS    Report Status 12/05/2017 FINAL  Final   Organism ID, Bacteria STAPHYLOCOCCUS CAPITIS  Final      Susceptibility   Staphylococcus capitis - MIC*    CIPROFLOXACIN 1 SENSITIVE Sensitive     ERYTHROMYCIN >=8 RESISTANT Resistant     GENTAMICIN <=0.5 SENSITIVE Sensitive      OXACILLIN <=0.25 SENSITIVE Sensitive     TETRACYCLINE <=1 SENSITIVE Sensitive     VANCOMYCIN <=0.5 SENSITIVE Sensitive     TRIMETH/SULFA <=10 SENSITIVE Sensitive     CLINDAMYCIN RESISTANT Resistant     RIFAMPIN <=0.5 SENSITIVE Sensitive     Inducible Clindamycin POSITIVE Resistant     * STAPHYLOCOCCUS CAPITIS  Blood Culture ID Panel (Reflexed)     Status: Abnormal   Collection Time: 11/30/17  2:14 PM  Result Value Ref Range Status   Enterococcus species NOT DETECTED NOT DETECTED Final   Listeria monocytogenes NOT DETECTED NOT DETECTED Final   Staphylococcus species DETECTED (A) NOT DETECTED Final    Comment: Methicillin (oxacillin) susceptible coagulase negative staphylococcus. Possible blood culture contaminant (unless isolated from more than one blood culture draw or clinical case suggests pathogenicity). No antibiotic treatment is indicated for blood  culture contaminants. CRITICAL RESULT CALLED TO, READ BACK BY AND VERIFIED WITH: L.SEAY, PHARMD AT 0418 ON 12/02/17 BY G.MCADOO    Staphylococcus aureus (BCID) NOT DETECTED NOT DETECTED Final   Methicillin resistance NOT DETECTED NOT DETECTED Final   Streptococcus species NOT DETECTED NOT DETECTED Final   Streptococcus agalactiae NOT DETECTED NOT DETECTED Final   Streptococcus pneumoniae NOT DETECTED NOT DETECTED Final   Streptococcus pyogenes NOT DETECTED NOT DETECTED Final   Acinetobacter baumannii NOT DETECTED NOT DETECTED Final   Enterobacteriaceae species NOT DETECTED NOT DETECTED Final   Enterobacter cloacae complex NOT DETECTED NOT DETECTED Final   Escherichia coli NOT DETECTED NOT DETECTED Final   Klebsiella oxytoca NOT DETECTED NOT DETECTED Final   Klebsiella pneumoniae NOT DETECTED NOT DETECTED Final   Proteus species NOT DETECTED NOT DETECTED Final   Serratia marcescens NOT DETECTED NOT DETECTED Final   Haemophilus influenzae NOT DETECTED NOT DETECTED Final   Neisseria meningitidis NOT DETECTED NOT DETECTED Final    Pseudomonas aeruginosa NOT DETECTED NOT DETECTED Final   Candida albicans NOT DETECTED NOT DETECTED Final   Candida glabrata NOT DETECTED NOT DETECTED Final   Candida krusei NOT DETECTED NOT DETECTED Final   Candida parapsilosis NOT DETECTED NOT DETECTED Final   Candida tropicalis NOT DETECTED NOT DETECTED Final    Comment: Performed at [facility], [place]. [address],  [place], [place] [address]  Culture, blood (routine x 2)     Status: Abnormal   Collection Time: 11/30/17  2:19 PM  Result Value Ref Range Status   Specimen Description BLOOD BLOOD LEFT ARM  Final   Special Requests   Final    BOTTLES DRAWN AEROBIC AND ANAEROBIC Blood Culture adequate volume   Culture  Setup Time   Final    GRAM POSITIVE COCCI IN CLUSTERS IN BOTH AEROBIC AND ANAEROBIC BOTTLES CRITICAL VALUE NOTED.  VALUE IS CONSISTENT WITH PREVIOUSLY REPORTED AND CALLED VALUE.    Culture GRANULICATELLA ADIACENS (A)  Final   Report Status 12/04/2017 FINAL  Final   Organism ID, Bacteria GRANULICATELLA ADIACENS  Final      Susceptibility   Granulicatella adiacens - MIC*    CEFTRIAXONE 0.5 SENSITIVE Sensitive     ERYTHROMYCIN <=0.12 SENSITIVE Sensitive     LEVOFLOXACIN <=0.25 SENSITIVE Sensitive     VANCOMYCIN Value in next row Sensitive      0.25 SENSITIVEPerformed at [facility] [address], [place], [place] [address]    * GRANULICATELLA ADIACENS  Culture, blood (routine x 2)     Status: None (Preliminary result)   Collection Time: 12/02/17  3:33 PM  Result Value Ref Range Status   Specimen Description BLOOD LEFT ANTECUBITAL  Final   Special Requests   Final    BOTTLES DRAWN AEROBIC AND ANAEROBIC Blood Culture adequate volume   Culture   Final    NO GROWTH 3 DAYS Performed at [facility], [place] [address], [place], [place] [address]    Report Status PENDING  Incomplete  Culture, blood (routine x 2)     Status: None (Preliminary result)   Collection Time: 12/02/17  3:39 PM  Result Value Ref  Range Status   Specimen Description BLOOD LEFT ARM  Final   Special Requests   Final    BOTTLES DRAWN AEROBIC AND ANAEROBIC Blood Culture adequate volume   Culture   Final    NO GROWTH 3 DAYS Performed at [facility], [address], [place], [place] [address]    Report Status PENDING  Incomplete         Radiology Studies: Dg Chest 1 View  Result Date: 12/04/2017 CLINICAL DATA:  Cough. EXAM: CHEST  1 VIEW COMPARISON:  Chest radiograph December 02, 2017 FINDINGS: Cardiac silhouette is moderately enlarged and unchanged. Fullness of the pulmonary hila with pulmonary vascular congestion and interstitial prominence. New small LEFT pleural effusion with patchy bibasilar airspace opacities. Biapical pleural thickening. No pneumothorax. Soft tissue planes and included osseous structures are nonacute. IMPRESSION: Cardiomegaly and interstitial prominence consistent with pulmonary edema. Small LEFT pleural effusion and bibasilar airspace opacities. Electronically Signed   By: Elon Alas M.D.   On: 12/04/2017 15:05   Dg Pelvis Portable  Result Date: 12/04/2017 CLINICAL DATA:  Pain in the left hip EXAM: PORTABLE PELVIS 1-2 VIEWS COMPARISON:  CT left hip 11/29/2017 and pelvis with left hip films of 11/29/2016 FINDINGS: The acute left subcapital femoral fracture demonstrated by CT is not well seen by plain film although there is slight cortical discontinuity in the left subcapital femur medially. No displacement of the fracture is seen. Both hip joint spaces are stable with more loss of hip joint space on the right. The pelvic rami are intact. The SI joints appear corticated. IMPRESSION: 1. The left femoral subcapital fracture demonstrated by CT is faintly visualized with no displacement. 2. No new or acute abnormality  is seen. Electronically Signed   By: Ivar Drape M.D.   On: 12/04/2017 12:35   Dg C-arm 1-60 Min  Result Date: 12/04/2017 CLINICAL DATA:  Surgical fixation of left femoral  neck fracture EXAM: DG C-ARM 61-120 MIN; OPERATIVE LEFT HIP WITH PELVIS COMPARISON:  12/04/2017 pelvic radiograph FINDINGS: Fluoroscopy time 1 minutes 12 seconds. 2 spot fluoroscopic intraoperative left hip radiographs demonstrate transfixation of the subcapital left femoral neck fracture in anatomic alignment by 3 pins. IMPRESSION: Intraoperative fluoroscopic guidance for open surgical fixation of left femoral neck fracture. Electronically Signed   By: Ilona Sorrel M.D.   On: 12/04/2017 20:36   Dg Hip Operative Unilat W Or W/o Pelvis Left  Result Date: 12/04/2017 CLINICAL DATA:  Surgical fixation of left femoral neck fracture EXAM: DG C-ARM 61-120 MIN; OPERATIVE LEFT HIP WITH PELVIS COMPARISON:  12/04/2017 pelvic radiograph FINDINGS: Fluoroscopy time 1 minutes 12 seconds. 2 spot fluoroscopic intraoperative left hip radiographs demonstrate transfixation of the subcapital left femoral neck fracture in anatomic alignment by 3 pins. IMPRESSION: Intraoperative fluoroscopic guidance for open surgical fixation of left femoral neck fracture. Electronically Signed   By: Ilona Sorrel M.D.   On: 12/04/2017 20:36   [place] Ekg Site Rite  Result Date: 12/05/2017 If Site Rite image not attached, placement could not be confirmed due to current cardiac rhythm.       Scheduled Meds: . carbamazepine  200 mg Oral BID  . docusate sodium  100 mg Oral BID  . enoxaparin (LOVENOX) injection  40 mg Subcutaneous Q24H  . gabapentin  300 mg Oral QHS  . insulin aspart  0-9 Units Subcutaneous TID WC  . Melatonin  3 mg Oral QHS  . pantoprazole  40 mg Oral Daily  . psyllium  1 packet Oral Daily  . senna  1 tablet Oral BID  . tamsulosin  0.4 mg Oral Daily  . vitamin B-12  1,000 mcg Oral Daily   Continuous Infusions: . sodium chloride 50 mL/hr at 12/04/17 2234  . penicillin g continuous IV infusion 9 Million Units (12/05/17 1249)     LOS: 6 days    Time spent: 35 minutes.     Elmarie Shiley, MD [facility] Pager [phone]  If 7PM-7AM, please contact night-coverage [email or web address] Password [facility] 12/05/2017, 4:41 PM

## 2017-12-06 DIAGNOSIS — H9190 Unspecified hearing loss, unspecified ear: Secondary | ICD-10-CM

## 2017-12-06 DIAGNOSIS — B379 Candidiasis, unspecified: Secondary | ICD-10-CM

## 2017-12-06 LAB — GLUCOSE, CAPILLARY
Glucose-Capillary: 135 mg/dL — ABNORMAL HIGH (ref 70–99)
Glucose-Capillary: 125 mg/dL — ABNORMAL HIGH (ref 70–99)
Glucose-Capillary: 185 mg/dL — ABNORMAL HIGH (ref 70–99)
Glucose-Capillary: 83 mg/dL (ref 70–99)

## 2017-12-06 LAB — BASIC METABOLIC PANEL
ANION GAP: 5 (ref 5–15)
BUN: 27 mg/dL — ABNORMAL HIGH (ref 8–23)
CALCIUM: 7.7 mg/dL — AB (ref 8.9–10.3)
CO2: 20 mmol/L — AB (ref 22–32)
CREATININE: 0.93 mg/dL (ref 0.61–1.24)
Chloride: 117 mmol/L — ABNORMAL HIGH (ref 98–111)
GFR calc Af Amer: >60 mL/min (ref >60)
GFR calc non Af Amer: >60 mL/min (ref >60)
GLUCOSE: 160 mg/dL — AB (ref 70–99)
Potassium: 3.9 mmol/L (ref 3.5–5.1)
Sodium: 142 mmol/L (ref 135–145)

## 2017-12-06 LAB — CBC
HCT: 30.3 % — ABNORMAL LOW (ref 39.0–52.0)
Hemoglobin: 9.7 g/dL — ABNORMAL LOW (ref 13.0–17.0)
MCH: 36.6 pg — AB (ref 26.0–34.0)
MCHC: 32 g/dL (ref 30.0–36.0)
MCV: 114.3 fL — ABNORMAL HIGH (ref 80.0–100.0)
PLATELETS: 158 10*3/uL (ref 150–400)
RBC: 2.65 MIL/uL — AB (ref 4.22–5.81)
RDW: 14.7 % (ref 11.5–15.5)
WBC: 11.7 10*3/uL — ABNORMAL HIGH (ref 4.0–10.5)
nRBC: 0.2 % (ref 0.0–0.2)

## 2017-12-06 MED ORDER — FUROSEMIDE 10 MG/ML IJ SOLN
20.0000 mg | Freq: Once | INTRAMUSCULAR | Status: AC
  Administered 2017-12-06: 20 mg via INTRAVENOUS
  Filled 2017-12-06: qty 2

## 2017-12-06 MED ORDER — POLYETHYLENE GLYCOL 3350 17 G PO PACK
17.0000 g | PACK | Freq: Every day | ORAL | Status: DC
  Administered 2017-12-06 – 2017-12-07 (×2): 17 g via ORAL
  Filled 2017-12-06 (×2): qty 1

## 2017-12-06 MED ORDER — ENOXAPARIN SODIUM 40 MG/0.4ML ~~LOC~~ SOLN
40.0000 mg | SUBCUTANEOUS | Status: DC
  Administered 2017-12-06 – 2017-12-07 (×2): 40 mg via SUBCUTANEOUS
  Filled 2017-12-06 (×2): qty 0.4

## 2017-12-06 MED ORDER — ENOXAPARIN SODIUM 40 MG/0.4ML ~~LOC~~ SOLN: 40.0000 mg | SUBCUTANEOUS | 0 refills | Status: AC

## 2017-12-06 MED ORDER — FLUCONAZOLE 200 MG PO TABS
200.0000 mg | ORAL_TABLET | Freq: Every day | ORAL | Status: DC
  Administered 2017-12-06 – 2017-12-07 (×2): 200 mg via ORAL
  Filled 2017-12-06 (×2): qty 1

## 2017-12-06 MED ORDER — SODIUM CHLORIDE 0.9% FLUSH
10.0000 mL | INTRAVENOUS | Status: DC | PRN
  Administered 2017-12-07: 10 mL
  Filled 2017-12-06: qty 40

## 2017-12-06 NOTE — Progress Notes (Signed)
[facility] for Infectious Disease  Date of Admission:  11/29/2017     Total days of antibiotics 7         ASSESSMENT/PLAN  Mr. Cobbins is on day 7 of antimicrobial therapy for Staphylococcus capitis/Granulicatella bacteremia discovered following a fall resulting in fracture of his left hip.  He is now status post screw fixation of the left hip and postop day 2 with no new complaints and has remained afebrile with surgical dressing being clean and dry.  Repeat cultures remain without growth.  Family is working on finding a skilled nursing facility upon discharge.  Wife's questions regarding treatment plan updated.  1.  Continue current dose of penicillin with end date of 12/16/2017. 2.  PICC line ordered and will be placed today. 3.  Continue to monitor cultures. 4.  Continue fluconazole x9 days for thrush  Diagnosis: Staphylococcus capitus / Granulicatella bacteremia   Culture Result: Same as above  Allergies  Allergen Reactions  . Aspirin Other (See Comments)    Per family aspirin causes him to bleed  . Fentanyl Rash    OPAT Orders Discharge antibiotics: Penicillin Per pharmacy protocol  Aim for Vancomycin trough 15-20 (unless otherwise indicated) Duration: 2 weeks End Date: 12/16/17  [facility] Care Per Protocol:  Labs weekly while on IV antibiotics: __ CBC with differential __ BMP __ CMP __ CRP __ ESR __ Vancomycin trough __ CK  _X_ Please pull PIC at completion of IV antibiotics __ Please leave PIC in place until doctor has seen patient or been notified  Fax weekly labs to [phone]  Clinic Follow Up Appt:   Principal Problem:   Closed left hip fracture, initial encounter [facility]) Active Problems:   Fall   GERD (gastroesophageal reflux disease)   Diabetes mellitus without complication ([place])   Trigeminal neuralgia   BPH (benign prostatic hyperplasia)   Closed left hip fracture ([place])   . carbamazepine  200 mg Oral BID  . docusate sodium  100 mg  Oral BID  . fluconazole  200 mg Oral Daily  . gabapentin  300 mg Oral QHS  . insulin aspart  0-9 Units Subcutaneous TID WC  . Melatonin  3 mg Oral QHS  . pantoprazole  40 mg Oral Daily  . polyethylene glycol  17 g Oral Daily  . psyllium  1 packet Oral Daily  . senna  1 tablet Oral BID  . tamsulosin  0.4 mg Oral Daily  . vitamin B-12  1,000 mcg Oral Daily    SUBJECTIVE:  Mr. Kelm has been afebrile overnight with slight increase of white blood cells to 11.7.  Started on fluconazole for thrush.  Repeat blood cultures have been negative to date.  He is resting comfortably.  Denies fevers, chills, or sweats.  Allergies  Allergen Reactions  . Aspirin Other (See Comments)    Per family aspirin causes him to bleed  . Fentanyl Rash     Review of Systems: Review of Systems  Constitutional: Negative for chills, fever and malaise/fatigue.  Respiratory: Negative for cough, sputum production, shortness of breath and wheezing.   Cardiovascular: Negative for chest pain and leg swelling.  Gastrointestinal: Negative for abdominal pain, constipation, diarrhea, nausea and vomiting.  Skin: Negative for rash.    OBJECTIVE: Vitals:   12/05/17 1949 12/06/17 0032 12/06/17 0404 12/06/17 1414  BP: 131/73 121/68 97/61 106/81  Pulse: 60 (!) 56 (!) 54 60  Resp: _0 Temp: 97.8 F (36.6 C) 97.6 F (  36.4 C) 97.6 F (36.4 C) (!) 97.5 F (36.4 C)  TempSrc: Oral Oral Oral Oral  SpO2: 93% 90% 93% 93%  Weight:      Height:       Body mass index is 22.53 kg/m.  Physical Exam  Constitutional: He appears well-developed and well-nourished. No distress.  Lying in bed with head of bed elevated; resting and easily arousable; hard of hearing.   Cardiovascular: Normal rate, regular rhythm, normal heart sounds and intact distal pulses.  Pulmonary/Chest: Effort normal and breath sounds normal.  Musculoskeletal:  Surgical dressing is clean and dry.   Skin: Skin is warm and dry.  Psychiatric: He  has a normal mood and affect.    Lab Results Lab Results  Component Value Date   WBC 11.7 (H) 12/06/2017   HGB 9.7 (L) 12/06/2017   HCT 30.3 (L) 12/06/2017   MCV 114.3 (H) 12/06/2017   PLT 158 12/06/2017    Lab Results  Component Value Date   CREATININE 0.93 12/06/2017   BUN 27 (H) 12/06/2017   NA 142 12/06/2017   K 3.9 12/06/2017   CL 117 (H) 12/06/2017   CO2 20 (L) 12/06/2017    Lab Results  Component Value Date   ALT 42 12/01/2017   AST 27 12/01/2017   ALKPHOS 91 12/01/2017   BILITOT 1.1 12/01/2017     Microbiology: Recent Results (from the past 240 hour(s))  MRSA PCR Screening     Status: None   Collection Time: 11/29/17 11:55 PM  Result Value Ref Range Status   MRSA by PCR NEGATIVE NEGATIVE Final    Comment:        The GeneXpert MRSA Assay (FDA approved for NASAL specimens only), is one component of a comprehensive MRSA colonization surveillance program. It is not intended to diagnose MRSA infection nor to guide or monitor treatment for MRSA infections. Performed at [facility], [place] [address], [place], [place] [address]   Urine Culture     Status: Abnormal   Collection Time: 11/30/17  1:57 PM  Result Value Ref Range Status   Specimen Description URINE, CATHETERIZED  Final   Special Requests   Final    NONE Performed at [facility], [place] [address] [place], [place] [address]    Culture MULTIPLE SPECIES PRESENT, SUGGEST RECOLLECTION (A)  Final   Report Status 12/01/2017 FINAL  Final  Culture, blood (routine x 2)     Status: Abnormal   Collection Time: 11/30/17  2:14 PM  Result Value Ref Range Status   Specimen Description BLOOD BLOOD RIGHT FOREARM  Final   Special Requests   Final    BOTTLES DRAWN AEROBIC AND ANAEROBIC Blood Culture results may not be optimal due to an excessive volume of blood received in culture bottles   Culture  Setup Time   Final    GRAM POSITIVE COCCI IN CLUSTERS IN BOTH AEROBIC AND ANAEROBIC BOTTLES CRITICAL  RESULT CALLED TO, READ BACK BY AND VERIFIED WITH: L.SEAY,PHARMD AT 1007 ON 12/02/17 BY G.[email or web address] Performed at [facility], [place] [address] [place], [place] [address]    Culture (A)  Final    GRANULICATELLA ADIACENS SUSCEPTIBILITIES PERFORMED ON PREVIOUS CULTURE WITHIN THE LAST 5 DAYS. STAPHYLOCOCCUS CAPITIS    Report Status 12/05/2017 FINAL  Final   Organism ID, Bacteria STAPHYLOCOCCUS CAPITIS  Final      Susceptibility   Staphylococcus capitis - MIC*    CIPROFLOXACIN 1 SENSITIVE Sensitive     ERYTHROMYCIN >=  8 RESISTANT Resistant     GENTAMICIN <=0.5 SENSITIVE Sensitive     OXACILLIN <=0.25 SENSITIVE Sensitive     TETRACYCLINE <=1 SENSITIVE Sensitive     VANCOMYCIN <=0.5 SENSITIVE Sensitive     TRIMETH/SULFA <=10 SENSITIVE Sensitive     CLINDAMYCIN RESISTANT Resistant     RIFAMPIN <=0.5 SENSITIVE Sensitive     Inducible Clindamycin POSITIVE Resistant     * STAPHYLOCOCCUS CAPITIS  Blood Culture ID Panel (Reflexed)     Status: Abnormal   Collection Time: 11/30/17  2:14 PM  Result Value Ref Range Status   Enterococcus species NOT DETECTED NOT DETECTED Final   Listeria monocytogenes NOT DETECTED NOT DETECTED Final   Staphylococcus species DETECTED (A) NOT DETECTED Final    Comment: Methicillin (oxacillin) susceptible coagulase negative staphylococcus. Possible blood culture contaminant (unless isolated from more than one blood culture draw or clinical case suggests pathogenicity). No antibiotic treatment is indicated for blood  culture contaminants. CRITICAL RESULT CALLED TO, READ BACK BY AND VERIFIED WITH: L.SEAY, PHARMD AT 0418 ON 12/02/17 BY G.MCADOO    Staphylococcus aureus (BCID) NOT DETECTED NOT DETECTED Final   Methicillin resistance NOT DETECTED NOT DETECTED Final   Streptococcus species NOT DETECTED NOT DETECTED Final   Streptococcus agalactiae NOT DETECTED NOT DETECTED Final   Streptococcus pneumoniae NOT DETECTED NOT DETECTED Final   Streptococcus pyogenes NOT  DETECTED NOT DETECTED Final   Acinetobacter baumannii NOT DETECTED NOT DETECTED Final   Enterobacteriaceae species NOT DETECTED NOT DETECTED Final   Enterobacter cloacae complex NOT DETECTED NOT DETECTED Final   Escherichia coli NOT DETECTED NOT DETECTED Final   Klebsiella oxytoca NOT DETECTED NOT DETECTED Final   Klebsiella pneumoniae NOT DETECTED NOT DETECTED Final   Proteus species NOT DETECTED NOT DETECTED Final   Serratia marcescens NOT DETECTED NOT DETECTED Final   Haemophilus influenzae NOT DETECTED NOT DETECTED Final   Neisseria meningitidis NOT DETECTED NOT DETECTED Final   Pseudomonas aeruginosa NOT DETECTED NOT DETECTED Final   Candida albicans NOT DETECTED NOT DETECTED Final   Candida glabrata NOT DETECTED NOT DETECTED Final   Candida krusei NOT DETECTED NOT DETECTED Final   Candida parapsilosis NOT DETECTED NOT DETECTED Final   Candida tropicalis NOT DETECTED NOT DETECTED Final    Comment: Performed at [facility], [place]. [address], [place], [place] [address]  Culture, blood (routine x 2)     Status: Abnormal   Collection Time: 11/30/17  2:19 PM  Result Value Ref Range Status   Specimen Description BLOOD BLOOD LEFT ARM  Final   Special Requests   Final    BOTTLES DRAWN AEROBIC AND ANAEROBIC Blood Culture adequate volume   Culture  Setup Time   Final    GRAM POSITIVE COCCI IN CLUSTERS IN BOTH AEROBIC AND ANAEROBIC BOTTLES CRITICAL VALUE NOTED.  VALUE IS CONSISTENT WITH PREVIOUSLY REPORTED AND CALLED VALUE.    Culture GRANULICATELLA ADIACENS (A)  Final   Report Status 12/04/2017 FINAL  Final   Organism ID, Bacteria GRANULICATELLA ADIACENS  Final      Susceptibility   Granulicatella adiacens - MIC*    CEFTRIAXONE 0.5 SENSITIVE Sensitive     ERYTHROMYCIN <=0.12 SENSITIVE Sensitive     LEVOFLOXACIN <=0.25 SENSITIVE Sensitive     VANCOMYCIN Value in next row Sensitive      0.25 SENSITIVEPerformed at [place] [address] [place], [place] [address]    *  GRANULICATELLA ADIACENS  Culture, blood (routine x 2)     Status:  None (Preliminary result)   Collection Time: 12/02/17  3:33 PM  Result Value Ref Range Status   Specimen Description BLOOD LEFT ANTECUBITAL  Final   Special Requests   Final    BOTTLES DRAWN AEROBIC AND ANAEROBIC Blood Culture adequate volume   Culture   Final    NO GROWTH 4 DAYS Performed at [facility], [address] [place], [place] [address]    Report Status PENDING  Incomplete  Culture, blood (routine x 2)     Status: None (Preliminary result)   Collection Time: 12/02/17  3:39 PM  Result Value Ref Range Status   Specimen Description BLOOD LEFT ARM  Final   Special Requests   Final    BOTTLES DRAWN AEROBIC AND ANAEROBIC Blood Culture adequate volume   Culture   Final    NO GROWTH 4 DAYS Performed at [facility], [address] [place], [place] [address]    Report Status PENDING  Incomplete     Terri Piedra, [place] for [place] Pager  12/06/2017  5:52 PM

## 2017-12-06 NOTE — Plan of Care (Signed)
  Problem: Clinical Measurements: °Goal: Postoperative complications will be avoided or minimized °Outcome: Progressing °  °Problem: Pain Management: °Goal: Pain level will decrease °Outcome: Progressing °  °Problem: Education: °Goal: Knowledge of General Education information will improve °Description: Including pain rating scale, medication(s)/side effects and non-pharmacologic comfort measures °Outcome: Progressing °  °

## 2017-12-06 NOTE — Progress Notes (Signed)
Monitor patient urine per MD verbal order. Will continue to monitor patient.

## 2017-12-06 NOTE — Progress Notes (Signed)
Patient still having some bleeding from his penis per nurse tech. Will report off to night nurse. Will continue to monitor patient.

## 2017-12-06 NOTE — Clinical Social Work Note (Signed)
Clinical Social Work Assessment  Patient Details  Name: Terry Harrell MRN: [id number] Date of Birth: 04-Mar-1927  Date of referral:  12/06/17               Reason for consult:  Discharge Planning                Permission sought to share information with:  Case Manager, Facility Sport and exercise psychologist, Family Supports Permission granted to share information::  Yes, Verbal Permission Granted  Name::     Nature conservation officer::  SNFs  Relationship::  spouse  Contact Information:  [phone]  Housing/Transportation Living arrangements for the past 2 months:  [place] of Information:  Spouse Patient Interpreter Needed:  None Criminal Activity/Legal Involvement Pertinent to Current Situation/Hospitalization:  No - Comment as needed Significant Relationships:  Spouse Lives with:  Spouse Do you feel safe going back to the place where you live?  No Need for family participation in patient care:  Yes (Comment)  Care giving concerns:  CSW received referral for possible SNF placement at time of discharge. Spoke with patient's wife [name] regarding possibility of SNF placement as patient has dementia and hearing impairments . Patient's wife is currently unable to care for him at their home given patient's current needs and fall risk.  Patient's wife expressed understanding of PT recommendation and are agreeable to SNF placement at time of discharge. CSW to continue to follow and assist with discharge planning needs.     Social Worker assessment / plan:  Spoke with patient's spouse  concerning possibility of rehab at [facility] before returning home.    Employment status:  Retired Nurse, adult PT Recommendations:  [place] / Referral to community resources:  [place]  Patient/Family's Response to care:  Patient and     recognize need for rehab before returning home and are agreeable to a SNF near [employer] area.  They report preference for  [facility], [facility] or [facility] . CSW explained insurance authorization process. Patient's family reported that they want patient to get stronger to be able to come back home.    Patient/Family's Understanding of and Emotional Response to Diagnosis, Current Treatment, and Prognosis:  Patient/family is realistic regarding therapy needs and expressed being hopeful for SNF placement. Patient expressed understanding of CSW role and discharge process as well as medical condition. No questions/concerns about plan or treatment.    Emotional Assessment Appearance:  Appears stated age Attitude/Demeanor/Rapport:  Gracious Affect (typically observed):  Accepting Orientation:  Oriented to Self Alcohol / Substance use:  Not Applicable Psych involvement (Current and /or in the community):  No (Comment)  Discharge Needs  Concerns to be addressed:  Discharge Planning Concerns Readmission within the last 30 days:  No Current discharge risk:  Dependent with Mobility Barriers to Discharge:  Continued Medical Work up   [employer], LCSW 12/06/2017, 11:21 AM

## 2017-12-06 NOTE — Progress Notes (Signed)
Foley catheter removed, urine was bright red. MD notified, patient tolerated well.

## 2017-12-06 NOTE — Progress Notes (Signed)
PHARMACY CONSULT NOTE FOR:  OUTPATIENT  PARENTERAL ANTIBIOTIC THERAPY (OPAT)  Indication: Bacteremia  Regimen: penicillin G 18 million units IV q 24 hours as continuous infusion  End date: 12/16/17  IV antibiotic discharge orders are pended. To discharging provider:  please sign these orders via discharge navigator,  Select New Orders & click on the button choice - Manage This Unsigned Work.     Thank you for allowing pharmacy to be a part of this patient's care.  Terry Harrell Terry Harrell 12/06/2017, 1:01 PM

## 2017-12-06 NOTE — Progress Notes (Signed)
Peripherally Inserted Central Catheter/Midline Placement  The IV Nurse has discussed with the patient and/or persons authorized to consent for the patient, the purpose of this procedure and the potential benefits and risks involved with this procedure.  The benefits include less needle sticks, lab draws from the catheter, and the patient may be discharged home with the catheter. Risks include, but not limited to, infection, bleeding, blood clot (thrombus formation), and puncture of an artery; nerve damage and irregular heartbeat and possibility to perform a PICC exchange if needed/ordered by physician.  Alternatives to this procedure were also discussed.  Bard Power PICC patient education guide, fact sheet on infection prevention and patient information card has been provided to patient /or left at bedside.    PICC/Midline Placement Documentation     Consent obtained with wife at bedside   Darlyn Read 12/06/2017, 3:01 PM

## 2017-12-06 NOTE — Progress Notes (Signed)
Orthopedics Progress Note  Subjective: Patient resting comfortably. No pain in the hip  Objective:  Vitals:   12/06/17 1414 12/06/17 2051  BP: 106/81 128/75  Pulse: 60 62  Resp: 17 15  Temp: (!) 97.5 F (36.4 C) 97.6 F (36.4 C)  SpO2: 93% 97%    General: Awake and alert  Musculoskeletal: left hip dressing CDI, no cords no pain with PROM of the ankle Neurovascularly intact  Lab Results  Component Value Date   WBC 11.7 (H) 12/06/2017   HGB 9.7 (L) 12/06/2017   HCT 30.3 (L) 12/06/2017   MCV 114.3 (H) 12/06/2017   PLT 158 12/06/2017       Component Value Date/Time   NA 142 12/06/2017 0326   K 3.9 12/06/2017 0326   CL 117 (H) 12/06/2017 0326   CO2 20 (L) 12/06/2017 0326   GLUCOSE 160 (H) 12/06/2017 0326   BUN 27 (H) 12/06/2017 0326   CREATININE 0.93 12/06/2017 0326   CALCIUM 7.7 (L) 12/06/2017 0326   GFRNONAA >60 12/06/2017 0326   GFRAA >60 12/06/2017 0326    Lab Results  Component Value Date   INR 1.07 11/29/2017    Assessment/Plan: POD #2 s/p Procedure(s): PERCUTANEOUS CANNULATED SCREW FIXATION LEFT HIP Mobilization minimal WB on the left leg, transfers if unable to protect WB status. Continue Abx per ID DVT prophylaxis - Lovenox and mechanical   Terry Harrell. Terry Fells, MD 12/06/2017 10:04 PM

## 2017-12-06 NOTE — Progress Notes (Signed)
PROGRESS NOTE    Terry Harrell  OJJ:[id number] DOB: 1927/11/10 DOA: 11/29/2017 PCP: Drake Leach, MD   Brief Narrative: Terry Harrell a 84 y.o.malewith medical history significant foracoustic neuroma, trigeminal neuralgia, diabetes, GERD presents to the ED complaining of left hip pain after mechanical fall at home. History provided by wife as patient is extremely hard of hearing. Wife stated she normally assists patient during ambulation,but while she was in the bathroom, patient decided to ambulate unassisted,resulting to him falling on his side, denieshitting his head or losing consciousness. Patient was okay ambulating for about an hour after the fall,later complained of significant left hip pain and was unable to ambulate, hencebringing him to the ED. Patient denies any chest pain, shortness of breath, palpitations, dizziness, abdominal pain, nausea/vomiting, diarrhea, cough, fever/chills. Wife stated patient is very prone to falling if he gets up unassisted. In the ED, CT left hip showed acute subcapital left femoral neck fracture. Orthopedics consulted. Patient admitted for further management.   Assessment & Plan:   Principal Problem:   Closed left hip fracture, initial encounter [facility]) Active Problems:   Fall   GERD (gastroesophageal reflux disease)   Diabetes mellitus without complication (HCC)   Trigeminal neuralgia   BPH (benign prostatic hyperplasia)   Closed left hip fracture (HCC)   1-Sepsis, secondary to Granulicatella adiacens bacteremia. Related to dentition vs PNA.  Afebrile.  BC with 4/4 bottle growing granulicatella adiacens repeat BC X 2 NGTD ID recommends 2 weeks of IV antibiotics and placement of picc line. Explain to family need for PICC line.  On penicillin. Needs 2 weeks of antibiotics. Last dose 11-03.  2-AKI; resolved with fluids.   Elevated D dimer;  VQ scan negative,.   Anemia; macrocytic anemia; iron deficiency.  baseline 12.  Low  iron. Received IV iron.   Acute subcapital left femoral neck fracture.  Underwent sx 10-22. dvt prophylaxis per ortho.   DM  SSI.   Trigeminal neuralgia;  Patient denies pain today. He was eating lunch.  IV dose phenytoin help.  Resume home dose tegretol, per wife dose is 200 mg BID.   BPH Continue Flomax  Hard of hearing.  Hematuria; monitor might be from trauma from foley.   Acute hypoxic respiratory failure.  On 2 l oxygen.  NSL fluids.  Will give one dose of lasix.     DVT prophylaxis: lovenox, hold due to hematuria Code Status:full code. Was previously partial code. Per wife patient can be intubated short term.  Family Communication: wife at bedside,.  Disposition Plan:needs SNF  Consultants:  Ortho ID   Procedures:  PERCUTANEOUS CANNULATED SCREW FIXATION LEFT HIP 10-22   Antimicrobials:   Penicillin.    Subjective: Sleepy, holding heating patch on his face. Denies jaw pain.  Denies dyspnea.   Objective: Vitals:   12/05/17 1749 12/05/17 1949 12/06/17 0032 12/06/17 0404  BP: 108/62 131/73 121/68 97/61  Pulse: (!) 55 60 (!) 56 (!) 54  Resp: 18 17 15 15   Temp: 97.9 F (36.6 C) 97.8 F (36.6 C) 97.6 F (36.4 C) 97.6 F (36.4 C)  TempSrc: Oral Oral Oral Oral  SpO2: 91% 93% 90% 93%  Weight:      Height:        Intake/Output Summary (Last 24 hours) at 12/06/2017 1112 Last data filed at 12/06/2017 0500 Gross per 24 hour  Intake 2515.18 ml  Output 400 ml  Net 2115.18 ml   Filed Weights   11/29/17 1532  Weight: 71.2 kg    Examination:  General exam: NAD Respiratory system: Bilateral crackles.  Cardiovascular system: S 1, S 2 RRR Gastrointestinal system: BS present, soft nt Central nervous system: alert  Extremities: no edema, left LE with dressing.  Skin: no rashes.     Data Reviewed: I have personally reviewed following labs and imaging studies  CBC: Recent Labs  Lab 11/29/17 1839  12/02/17 0325 12/03/17 0535  12/04/17 2154 12/05/17 0200 12/06/17 0326  WBC 11.3*   < > 12.0* 7.6 9.8 9.8 11.7*  NEUTROABS 9.2*  --  10.5* 5.1  --  7.4  --   HGB 12.2*   < > 9.7* 9.4* 11.0* 10.2* 9.7*  HCT 36.6*   < > 29.2* 28.9* 34.3* 32.6* 30.3*  MCV 112.3*   < > 113.6* 115.1* 113.6* 115.6* 114.3*  PLT 209   < > 142* 132* 168 144* 158   < > = values in this interval not displayed.   Basic Metabolic Panel: Recent Labs  Lab 12/01/17 0444 12/02/17 0325 12/03/17 0535 12/04/17 2154 12/05/17 0200 12/06/17 0326  NA 138 140 142  --  142 142  K 4.3 3.8 3.8  --  4.3 3.9  CL 112* 112* 116*  --  117* 117*  CO2 19* 21* 21*  --  18* 20*  GLUCOSE 171* 169* 139*  --  200* 160*  BUN 32* 38* 36*  --  29* 27*  CREATININE 1.32* 1.34* 1.00 0.97 0.99 0.93  CALCIUM 8.0* 7.8* 7.6*  --  7.9* 7.7*   GFR: Estimated Creatinine Clearance: 55.3 mL/min (by C-G formula based on SCr of 0.93 mg/dL). Liver Function Tests: Recent Labs  Lab 12/01/17 0829  AST 27  ALT 42  ALKPHOS 91  BILITOT 1.1  PROT 5.6*  ALBUMIN 2.6*   No results for input(s): LIPASE, AMYLASE in the last 168 hours. No results for input(s): AMMONIA in the last 168 hours. Coagulation Profile: Recent Labs  Lab 11/29/17 1839  INR 1.07   Cardiac Enzymes: Recent Labs  Lab 12/01/17 0829  CKTOTAL 66   BNP (last 3 results) No results for input(s): PROBNP in the last 8760 hours. HbA1C: No results for input(s): HGBA1C in the last 72 hours. CBG: Recent Labs  Lab 12/05/17 0817 12/05/17 1120 12/05/17 1625 12/05/17 2104 12/06/17 0640  GLUCAP 140* 177* 110* 154* 135*   Lipid Profile: No results for input(s): CHOL, HDL, LDLCALC, TRIG, CHOLHDL, LDLDIRECT in the last 72 hours. Thyroid Function Tests: No results for input(s): TSH, T4TOTAL, FREET4, T3FREE, THYROIDAB in the last 72 hours. Anemia Panel: No results for input(s): VITAMINB12, FOLATE, FERRITIN, TIBC, IRON, RETICCTPCT in the last 72 hours. Sepsis Labs: Recent Labs  Lab 11/30/17 1415  11/30/17 1926 12/01/17 0829 12/01/17 0838 12/02/17 0325 12/03/17 0535  PROCALCITON  --   --   --  14.29 13.06 5.66  LATICACIDVEN 2.1* 3.9* 1.6  --   --   --     Recent Results (from the past 240 hour(s))  MRSA PCR Screening     Status: None   Collection Time: 11/29/17 11:55 PM  Result Value Ref Range Status   MRSA by PCR NEGATIVE NEGATIVE Final    Comment:        The GeneXpert MRSA Assay (FDA approved for NASAL specimens only), is one component of a comprehensive MRSA colonization surveillance program. It is not intended to diagnose MRSA infection nor to guide or monitor treatment for MRSA infections. Performed at [facility], [place] [address], [place], [place] [address]  Urine Culture     Status: Abnormal   Collection Time: 11/30/17  1:57 PM  Result Value Ref Range Status   Specimen Description URINE, CATHETERIZED  Final   Special Requests   Final    NONE Performed at [facility], [address] [place], [place] [address]    Culture MULTIPLE SPECIES PRESENT, SUGGEST RECOLLECTION (A)  Final   Report Status 12/01/2017 FINAL  Final  Culture, blood (routine x 2)     Status: Abnormal   Collection Time: 11/30/17  2:14 PM  Result Value Ref Range Status   Specimen Description BLOOD BLOOD RIGHT FOREARM  Final   Special Requests   Final    BOTTLES DRAWN AEROBIC AND ANAEROBIC Blood Culture results may not be optimal due to an excessive volume of blood received in culture bottles   Culture  Setup Time   Final    GRAM POSITIVE COCCI IN CLUSTERS IN BOTH AEROBIC AND ANAEROBIC BOTTLES CRITICAL RESULT CALLED TO, READ BACK BY AND VERIFIED WITH: L.SEAY,PHARMD AT 2505 ON 12/02/17 BY G.MCADOO Performed at [facility], [place] [address] [place],  [address]    Culture (A)  Final    GRANULICATELLA ADIACENS SUSCEPTIBILITIES PERFORMED ON PREVIOUS CULTURE WITHIN THE LAST 5 DAYS. STAPHYLOCOCCUS CAPITIS    Report Status 12/05/2017 FINAL  Final   Organism ID,  Bacteria STAPHYLOCOCCUS CAPITIS  Final      Susceptibility   Staphylococcus capitis - MIC*    CIPROFLOXACIN 1 SENSITIVE Sensitive     ERYTHROMYCIN >=8 RESISTANT Resistant     GENTAMICIN <=0.5 SENSITIVE Sensitive     OXACILLIN <=0.25 SENSITIVE Sensitive     TETRACYCLINE <=1 SENSITIVE Sensitive     VANCOMYCIN <=0.5 SENSITIVE Sensitive     TRIMETH/SULFA <=10 SENSITIVE Sensitive     CLINDAMYCIN RESISTANT Resistant     RIFAMPIN <=0.5 SENSITIVE Sensitive     Inducible Clindamycin POSITIVE Resistant     * STAPHYLOCOCCUS CAPITIS  Blood Culture ID Panel (Reflexed)     Status: Abnormal   Collection Time: 11/30/17  2:14 PM  Result Value Ref Range Status   Enterococcus species NOT DETECTED NOT DETECTED Final   Listeria monocytogenes NOT DETECTED NOT DETECTED Final   Staphylococcus species DETECTED (A) NOT DETECTED Final    Comment: Methicillin (oxacillin) susceptible coagulase negative staphylococcus. Possible blood culture contaminant (unless isolated from more than one blood culture draw or clinical case suggests pathogenicity). No antibiotic treatment is indicated for blood  culture contaminants. CRITICAL RESULT CALLED TO, READ BACK BY AND VERIFIED WITH: L.SEAY, PHARMD AT 0418 ON 12/02/17 BY G.MCADOO    Staphylococcus aureus (BCID) NOT DETECTED NOT DETECTED Final   Methicillin resistance NOT DETECTED NOT DETECTED Final   Streptococcus species NOT DETECTED NOT DETECTED Final   Streptococcus agalactiae NOT DETECTED NOT DETECTED Final   Streptococcus pneumoniae NOT DETECTED NOT DETECTED Final   Streptococcus pyogenes NOT DETECTED NOT DETECTED Final   Acinetobacter baumannii NOT DETECTED NOT DETECTED Final   Enterobacteriaceae species NOT DETECTED NOT DETECTED Final   Enterobacter cloacae complex NOT DETECTED NOT DETECTED Final   Escherichia coli NOT DETECTED NOT DETECTED Final   Klebsiella oxytoca NOT DETECTED NOT DETECTED Final   Klebsiella pneumoniae NOT DETECTED NOT DETECTED Final    Proteus species NOT DETECTED NOT DETECTED Final   Serratia marcescens NOT DETECTED NOT DETECTED Final   Haemophilus influenzae NOT DETECTED NOT DETECTED Final   Neisseria meningitidis NOT DETECTED NOT DETECTED Final   Pseudomonas aeruginosa NOT DETECTED NOT  DETECTED Final   Candida albicans NOT DETECTED NOT DETECTED Final   Candida glabrata NOT DETECTED NOT DETECTED Final   Candida krusei NOT DETECTED NOT DETECTED Final   Candida parapsilosis NOT DETECTED NOT DETECTED Final   Candida tropicalis NOT DETECTED NOT DETECTED Final    Comment: Performed at [facility], [place] [address], [place], [place] [address]  Culture, blood (routine x 2)     Status: Abnormal   Collection Time: 11/30/17  2:19 PM  Result Value Ref Range Status   Specimen Description BLOOD BLOOD LEFT ARM  Final   Special Requests   Final    BOTTLES DRAWN AEROBIC AND ANAEROBIC Blood Culture adequate volume   Culture  Setup Time   Final    GRAM POSITIVE COCCI IN CLUSTERS IN BOTH AEROBIC AND ANAEROBIC BOTTLES CRITICAL VALUE NOTED.  VALUE IS CONSISTENT WITH PREVIOUSLY REPORTED AND CALLED VALUE.    Culture GRANULICATELLA ADIACENS (A)  Final   Report Status 12/04/2017 FINAL  Final   Organism ID, Bacteria GRANULICATELLA ADIACENS  Final      Susceptibility   Granulicatella adiacens - MIC*    CEFTRIAXONE 0.5 SENSITIVE Sensitive     ERYTHROMYCIN <=0.12 SENSITIVE Sensitive     LEVOFLOXACIN <=0.25 SENSITIVE Sensitive     VANCOMYCIN Value in next row Sensitive      0.25 SENSITIVEPerformed at [place] [address], [place], [place] [address]    * GRANULICATELLA ADIACENS  Culture, blood (routine x 2)     Status: None (Preliminary result)   Collection Time: 12/02/17  3:33 PM  Result Value Ref Range Status   Specimen Description BLOOD LEFT ANTECUBITAL  Final   Special Requests   Final    BOTTLES DRAWN AEROBIC AND ANAEROBIC Blood Culture adequate volume   Culture   Final    NO GROWTH 4 DAYS Performed at [facility], [place] [address], [place], [place] [address]    Report Status PENDING  Incomplete  Culture, blood (routine x 2)     Status: None (Preliminary result)   Collection Time: 12/02/17  3:39 PM  Result Value Ref Range Status   Specimen Description BLOOD LEFT ARM  Final   Special Requests   Final    BOTTLES DRAWN AEROBIC AND ANAEROBIC Blood Culture adequate volume   Culture   Final    NO GROWTH 4 DAYS Performed at [facility], [address], [place], [place] [address]    Report Status PENDING  Incomplete         Radiology Studies: Dg Chest 1 View  Result Date: 12/04/2017 CLINICAL DATA:  Cough. EXAM: CHEST  1 VIEW COMPARISON:  Chest radiograph December 02, 2017 FINDINGS: Cardiac silhouette is moderately enlarged and unchanged. Fullness of the pulmonary hila with pulmonary vascular congestion and interstitial prominence. New small LEFT pleural effusion with patchy bibasilar airspace opacities. Biapical pleural thickening. No pneumothorax. Soft tissue planes and included osseous structures are nonacute. IMPRESSION: Cardiomegaly and interstitial prominence consistent with pulmonary edema. Small LEFT pleural effusion and bibasilar airspace opacities. Electronically Signed   By: Elon Alas M.D.   On: 12/04/2017 15:05   Dg Pelvis Portable  Result Date: 12/04/2017 CLINICAL DATA:  Pain in the left hip EXAM: PORTABLE PELVIS 1-2 VIEWS COMPARISON:  CT left hip 11/29/2017 and pelvis with left hip films of 11/29/2016 FINDINGS: The acute left subcapital femoral fracture demonstrated by CT is not well seen by plain film although there is slight cortical discontinuity  in the left subcapital femur medially. No displacement of the fracture is seen. Both hip joint spaces are stable with more loss of hip joint space on the right. The pelvic rami are intact. The SI joints appear corticated. IMPRESSION: 1. The left femoral subcapital fracture demonstrated by CT is faintly visualized with no  displacement. 2. No new or acute abnormality is seen. Electronically Signed   By: Ivar Drape M.D.   On: 12/04/2017 12:35   Dg C-arm 1-60 Min  Result Date: 12/04/2017 CLINICAL DATA:  Surgical fixation of left femoral neck fracture EXAM: DG C-ARM 61-120 MIN; OPERATIVE LEFT HIP WITH PELVIS COMPARISON:  12/04/2017 pelvic radiograph FINDINGS: Fluoroscopy time 1 minutes 12 seconds. 2 spot fluoroscopic intraoperative left hip radiographs demonstrate transfixation of the subcapital left femoral neck fracture in anatomic alignment by 3 pins. IMPRESSION: Intraoperative fluoroscopic guidance for open surgical fixation of left femoral neck fracture. Electronically Signed   By: Ilona Sorrel M.D.   On: 12/04/2017 20:36   Dg Hip Operative Unilat W Or W/o Pelvis Left  Result Date: 12/04/2017 CLINICAL DATA:  Surgical fixation of left femoral neck fracture EXAM: DG C-ARM 61-120 MIN; OPERATIVE LEFT HIP WITH PELVIS COMPARISON:  12/04/2017 pelvic radiograph FINDINGS: Fluoroscopy time 1 minutes 12 seconds. 2 spot fluoroscopic intraoperative left hip radiographs demonstrate transfixation of the subcapital left femoral neck fracture in anatomic alignment by 3 pins. IMPRESSION: Intraoperative fluoroscopic guidance for open surgical fixation of left femoral neck fracture. Electronically Signed   By: Ilona Sorrel M.D.   On: 12/04/2017 20:36   [place] Ekg Site Rite  Result Date: 12/05/2017 If Site Rite image not attached, placement could not be confirmed due to current cardiac rhythm.       Scheduled Meds: . carbamazepine  200 mg Oral BID  . docusate sodium  100 mg Oral BID  . enoxaparin (LOVENOX) injection  40 mg Subcutaneous Q24H  . fluconazole  200 mg Oral Daily  . furosemide  20 mg Intravenous Once  . gabapentin  300 mg Oral QHS  . insulin aspart  0-9 Units Subcutaneous TID WC  . Melatonin  3 mg Oral QHS  . pantoprazole  40 mg Oral Daily  . polyethylene glycol  17 g Oral Daily  . psyllium  1 packet Oral Daily    . senna  1 tablet Oral BID  . tamsulosin  0.4 mg Oral Daily  . vitamin B-12  1,000 mcg Oral Daily   Continuous Infusions: . penicillin g continuous IV infusion 9 Million Units (12/06/17 0025)     LOS: 7 days    Time spent: 35 minutes.     Elmarie Shiley, MD [facility] Pager [phone]  If 7PM-7AM, please contact night-coverage [email or web address] Password [email or web address] 12/06/2017, 11:12 AM

## 2017-12-06 NOTE — Plan of Care (Signed)
  Problem: Education: Goal: Verbalization of understanding the information provided (i.e., activity precautions, restrictions, etc) will improve Outcome: Progressing   Problem: Clinical Measurements: Goal: Postoperative complications will be avoided or minimized Outcome: Progressing   Problem: Pain Management: Goal: Pain level will decrease Outcome: Progressing   Problem: Clinical Measurements: Goal: Will remain free from infection Outcome: Progressing Goal: Respiratory complications will improve Outcome: Progressing   Problem: Activity: Goal: Risk for activity intolerance will decrease Outcome: Progressing   Problem: Elimination: Goal: Will not experience complications related to bowel motility Outcome: Progressing Goal: Will not experience complications related to urinary retention Outcome: Progressing   Problem: Safety: Goal: Ability to remain free from injury will improve Outcome: Progressing   Problem: Skin Integrity: Goal: Risk for impaired skin integrity will decrease Outcome: Progressing

## 2017-12-06 NOTE — NC FL2 (Signed)
[place] LEVEL OF CARE SCREENING TOOL     IDENTIFICATION  Patient Name: Terry Harrell Birthdate: January 28, 1928 Sex: male Admission Date (Current Location): 11/29/2017  [facility] and [place] Number:  Herbalist and Address:  [facility] [place], [place] [address], [place], [place] [address]      Provider Number: [id number]  Attending Physician Name and Address:  Elmarie Shiley, MD  Relative Name and Phone Number:  [name] (spouse) [phone]    Current Level of Care: Hospital Recommended Level of Care: [place] Prior Approval Number:    Date Approved/Denied:   PASRR Number: [id number]  Discharge Plan: SNF    Current Diagnoses: Patient Active Problem List   Diagnosis Date Noted  . Closed left hip fracture ([place]) 12/04/2017  . Closed left hip fracture, initial encounter ([place]) 11/29/2017  . Pressure injury of skin 05/03/2017  . Fall 05/02/2017  . Left rib fracture 05/02/2017  . BPH (benign prostatic hyperplasia) 05/02/2017  . GERD (gastroesophageal reflux disease)   . Diabetes mellitus without complication ([place])   . Trigeminal neuralgia     Orientation RESPIRATION BLADDER Height & Weight     Self  O2(Nasal Cannula 2L/min) Continent(urinary cath ) Weight: 157 lb (71.2 kg) Height:  5\' 10"  (177.8 cm)  BEHAVIORAL SYMPTOMS/MOOD NEUROLOGICAL BOWEL NUTRITION STATUS      Continent Diet(see discharge summary)  AMBULATORY STATUS COMMUNICATION OF NEEDS Skin   Extensive Assist Verbally(uses words but hard to understand, when communicating best to use motions/movement for him to see) PU Stage and Appropriate Care, Surgical wounds, Skin abrasions(pressure injury coccyx, left hip closed surgical incision, right and  left arm and elbow skin abrasion)                       Personal Care Assistance Level of Assistance  Bathing, Feeding, Dressing, Total care Bathing Assistance: Maximum assistance Feeding assistance: Limited  assistance Dressing Assistance: Maximum assistance Total Care Assistance: Maximum assistance   Functional Limitations Info  Sight, Hearing, Speech Sight Info: Adequate Hearing Info: Impaired(right ear is best, speak towards it) Speech Info: Impaired    SPECIAL CARE FACTORS FREQUENCY  PT (By licensed PT), OT (By licensed OT)     PT Frequency: min 2x weekly OT Frequency: min 2x weekly            Contractures Contractures Info: Not present    Additional Factors Info  Allergies, Code Status Code Status Info: full Allergies Info: Allergies:  Aspirin, Fentanyl           Current Medications (12/06/2017):  This is the current hospital active medication list Current Facility-Administered Medications  Medication Dose Route Frequency Provider Last Rate Last Dose  . 0.9 %  sodium chloride infusion   Intravenous Continuous Netta Cedars, MD 50 mL/hr at 12/04/17 2234    . acetaminophen (TYLENOL) tablet 650 mg  650 mg Oral Q6H PRN Netta Cedars, MD   650 mg at 12/05/17 2114  . acetaminophen (TYLENOL) tablet 650 mg  650 mg Oral Q6H PRN Netta Cedars, MD      . bisacodyl (DULCOLAX) suppository 10 mg  10 mg Rectal Daily PRN Netta Cedars, MD      . carbamazepine (TEGRETOL) tablet 200 mg  200 mg Oral BID Regalado, Belkys A, MD   200 mg at 12/06/17 0904  . docusate sodium (COLACE) capsule 100 mg  100 mg Oral BID Netta Cedars, MD   100 mg at 12/06/17 6010  .  enoxaparin (LOVENOX) injection 40 mg  40 mg Subcutaneous Q24H Netta Cedars, MD   40 mg at 12/06/17 8756  . fluconazole (DIFLUCAN) tablet 200 mg  200 mg Oral Daily Regalado, Belkys A, MD      . gabapentin (NEURONTIN) capsule 300 mg  300 mg Oral Donavan Burnet, MD   300 mg at 12/05/17 2115  . HYDROcodone-acetaminophen (NORCO/VICODIN) 5-325 MG per tablet 1-2 tablet  1-2 tablet Oral Q4H PRN Netta Cedars, MD   1 tablet at 12/04/17 2240  . insulin aspart (novoLOG) injection 0-9 Units  0-9 Units Subcutaneous TID WC Netta Cedars, MD   1  Units at 12/06/17 0740  . ipratropium-albuterol (DUONEB) 0.5-2.5 (3) MG/3ML nebulizer solution 3 mL  3 mL Nebulization Q6H PRN Netta Cedars, MD      . Melatonin TABS 3 mg  3 mg Oral Donavan Burnet, MD   3 mg at 12/05/17 2116  . methocarbamol (ROBAXIN) tablet 500 mg  500 mg Oral Q8H PRN Netta Cedars, MD   500 mg at 12/02/17 1152  . metoCLOPramide (REGLAN) tablet 5-10 mg  5-10 mg Oral Q8H PRN Netta Cedars, MD       Or  . metoCLOPramide (REGLAN) injection 5-10 mg  5-10 mg Intravenous Q8H PRN Netta Cedars, MD      . ondansetron [facility]) injection 4 mg  4 mg Intravenous Q8H PRN Netta Cedars, MD      . ondansetron [facility]) tablet 4 mg  4 mg Oral Q6H PRN Netta Cedars, MD       Or  . ondansetron [facility]) injection 4 mg  4 mg Intravenous Q6H PRN Netta Cedars, MD      . oxyCODONE-acetaminophen (PERCOCET/ROXICET) 5-325 MG per tablet 1 tablet  1 tablet Oral Q4H PRN Netta Cedars, MD   1 tablet at 12/03/17 1644  . pantoprazole (PROTONIX) EC tablet 40 mg  40 mg Oral Daily Netta Cedars, MD   40 mg at 12/06/17 0904  . penicillin G potassium 9 Million Units in dextrose 5 % 500 mL continuous infusion  9 Million Units Intravenous Joni Fears, MD 41.7 mL/hr at 12/06/17 0025 9 Million Units at 12/06/17 0025  . polyethylene glycol (MIRALAX / GLYCOLAX) packet 17 g  17 g Oral Daily PRN Netta Cedars, MD      . psyllium (HYDROCIL/METAMUCIL) packet 1 packet  1 packet Oral Daily Alma Friendly, MD   1 packet at 12/05/17 1133  . senna (SENOKOT) tablet 8.6 mg  1 tablet Oral BID Netta Cedars, MD   8.6 mg at 12/06/17 4332  . tamsulosin (FLOMAX) capsule 0.4 mg  0.4 mg Oral Daily Netta Cedars, MD   0.4 mg at 12/06/17 9518  . vitamin B-12 (CYANOCOBALAMIN) tablet 1,000 mcg  1,000 mcg Oral Daily Netta Cedars, MD   1,000 mcg at 12/06/17 8416  . zolpidem (AMBIEN) tablet 5 mg  5 mg Oral QHS PRN Netta Cedars, MD   5 mg at 11/30/17 0027     Discharge Medications: Please see discharge summary for a list of  discharge medications.  Relevant Imaging Results:  Relevant Lab Results:   Additional Information SSN: [id number]  Alberteen Sam, LCSW

## 2017-12-07 LAB — CULTURE, BLOOD (ROUTINE X 2)
Culture: NO GROWTH
Culture: NO GROWTH
SPECIAL REQUESTS: ADEQUATE
Special Requests: ADEQUATE

## 2017-12-07 LAB — GLUCOSE, CAPILLARY
GLUCOSE-CAPILLARY: 190 mg/dL — AB (ref 70–99)
GLUCOSE-CAPILLARY: 199 mg/dL — AB (ref 70–99)
Glucose-Capillary: 131 mg/dL — ABNORMAL HIGH (ref 70–99)
Glucose-Capillary: 133 mg/dL — ABNORMAL HIGH (ref 70–99)

## 2017-12-07 LAB — CBC
HEMATOCRIT: 34.7 % — AB (ref 39.0–52.0)
Hemoglobin: 11.5 g/dL — ABNORMAL LOW (ref 13.0–17.0)
MCH: 37.5 pg — ABNORMAL HIGH (ref 26.0–34.0)
MCHC: 33.1 g/dL (ref 30.0–36.0)
MCV: 113 fL — ABNORMAL HIGH (ref 80.0–100.0)
NRBC: 0.2 % (ref 0.0–0.2)
Platelets: 202 10*3/uL (ref 150–400)
RBC: 3.07 MIL/uL — ABNORMAL LOW (ref 4.22–5.81)
RDW: 14.6 % (ref 11.5–15.5)
WBC: 11.4 10*3/uL — AB (ref 4.0–10.5)

## 2017-12-07 LAB — BASIC METABOLIC PANEL
ANION GAP: 5 (ref 5–15)
BUN: 24 mg/dL — ABNORMAL HIGH (ref 8–23)
CO2: 23 mmol/L (ref 22–32)
CREATININE: 0.92 mg/dL (ref 0.61–1.24)
Calcium: 7.8 mg/dL — ABNORMAL LOW (ref 8.9–10.3)
Chloride: 111 mmol/L (ref 98–111)
GFR calc non Af Amer: >60 mL/min (ref >60)
Glucose, Bld: 137 mg/dL — ABNORMAL HIGH (ref 70–99)
Potassium: 3.8 mmol/L (ref 3.5–5.1)
Sodium: 139 mmol/L (ref 135–145)

## 2017-12-07 MED ORDER — SENNA 8.6 MG PO TABS: 1.0000 | ORAL_TABLET | Freq: Two times a day (BID) | ORAL | 0 refills | Status: AC

## 2017-12-07 MED ORDER — CARBAMAZEPINE 200 MG PO TABS: 200.0000 mg | ORAL_TABLET | Freq: Two times a day (BID) | ORAL | 0 refills | Status: AC

## 2017-12-07 MED ORDER — HEPARIN SOD (PORK) LOCK FLUSH 100 UNIT/ML IV SOLN
250.0000 [IU] | INTRAVENOUS | Status: AC | PRN
  Administered 2017-12-07: 250 [IU]

## 2017-12-07 MED ORDER — POLYETHYLENE GLYCOL 3350 17 G PO PACK: 17.0000 g | PACK | Freq: Every day | ORAL | 0 refills | Status: AC

## 2017-12-07 MED ORDER — FLUCONAZOLE 200 MG PO TABS: 200.0000 mg | ORAL_TABLET | Freq: Every day | ORAL | 0 refills | Status: AC

## 2017-12-07 MED ORDER — CYANOCOBALAMIN 1000 MCG PO TABS: 1000.0000 ug | ORAL_TABLET | Freq: Every day | ORAL | 0 refills | Status: AC

## 2017-12-07 MED ORDER — PENICILLIN G POTASSIUM IV (FOR PTA / DISCHARGE USE ONLY): 18.0000 10*6.[IU] | INTRAVENOUS | 0 refills | Status: AC

## 2017-12-07 NOTE — Progress Notes (Signed)
CSW continues to follow up on insurance authorization through [facility] in which at 11:00 am today they reported no insurance authorization yet.   CSW updated patient's wife as well. Family hopeful for insurance authorization by end of day today.   Terry Harrell, [place]

## 2017-12-07 NOTE — Clinical Social Work Placement (Signed)
   CLINICAL SOCIAL WORK PLACEMENT  NOTE  Date:  12/07/2017  Patient Details  Name: Terry Harrell MRN: [id number] Date of Birth: 1928-01-26  Clinical Social Work is seeking post-discharge placement for this patient at the [place] level of care (*CSW will initial, date and re-position this form in  chart as items are completed):  Yes   Patient/family provided with [facility] Work Department's list of facilities offering this level of care within the geographic area requested by the patient (or if unable, by the patient's family).  Yes   Patient/family informed of their freedom to choose among providers that offer the needed level of care, that participate in Medicare, Medicaid or managed care program needed by the patient, have an available bed and are willing to accept the patient.      Patient/family informed of [facility]'s ownership interest in [facility] and [facility], as well as of the fact that they are under no obligation to receive care at these facilities.  PASRR submitted to EDS on       PASRR number received on 12/06/17     Existing PASRR number confirmed on       FL2 transmitted to all facilities in geographic area requested by pt/family on 12/06/17     FL2 transmitted to all facilities within larger geographic area on       Patient informed that his/her managed care company has contracts with or will negotiate with certain facilities, including the following:        Yes   Patient/family informed of bed offers received.  Patient chooses bed at [facility]     Physician recommends and patient chooses bed at      Patient to be transferred to [facility] on 12/07/17.  Patient to be transferred to facility by [facility]     Patient family notified on 12/07/17 of transfer.  Name of family member notified:  [name] (spouse)     PHYSICIAN       Additional Comment:     _______________________________________________ Alberteen Sam, LCSW 12/07/2017, 4:00 PM

## 2017-12-07 NOTE — Progress Notes (Signed)
Spoke with Supervisor at [facility] to discuss about PICC line and pt receiving penicillin. Supervisor stated that their pharmacy at the facility will provide IV pump for pt. CN here aware.

## 2017-12-07 NOTE — Progress Notes (Signed)
Report given to [facility], Therapist, sports at [facility].

## 2017-12-07 NOTE — Discharge Summary (Signed)
Physician Discharge Summary  Terry Yaun ZOX:[id number] DOB: Aug 17, 1927 DOA: 11/29/2017  PCP: Drake Leach, MD  Admit date: 11/29/2017 Discharge date: 12/07/2017  Admitted From: Home  Disposition:  SNF  Recommendations for Outpatient Follow-up:  1. Follow up with PCP in 1-2 weeks 2. Please obtain BMP/CBC in one week 3. Follow up with ortho post sx.  4. Follow with neurology for trigeminal neuralgia     Discharge Condition; Stable.  CODE STATUS: full code.  Diet recommendation: Carb modified.   Brief/Interim Summary: Brief Narrative: Terry Harrell a 84 y.o.malewith medical history significant foracoustic neuroma, trigeminal neuralgia, diabetes, GERD presents to the ED complaining of left hip pain after mechanical fall at home. History provided by wife as patient is extremely hard of hearing. Wife stated she normally assists patient during ambulation,but while she was in the bathroom, patient decided to ambulate unassisted,resulting to him falling on his side, denieshitting his head or losing consciousness. Patient was okay ambulating for about an hour after the fall,later complained of significant left hip pain and was unable to ambulate, hencebringing him to the ED. Patient denies any chest pain, shortness of breath, palpitations, dizziness, abdominal pain, nausea/vomiting, diarrhea, cough, fever/chills. Wife stated patient is very prone to falling if he gets up unassisted. In the ED, CT left hip showed acute subcapital left femoral neck fracture. Orthopedics consulted. Patient admitted for further management.   Assessment & Plan:   Principal Problem:   Closed left hip fracture, initial encounter [facility]) Active Problems:   Fall   GERD (gastroesophageal reflux disease)   Diabetes mellitus without complication (HCC)   Trigeminal neuralgia   BPH (benign prostatic hyperplasia)   Closed left hip fracture (HCC)   1-Sepsis, secondary to Granulicatella adiacens  bacteremia. Related to dentition vs PNA.  Afebrile.  BC with 4/4 bottle growinggranulicatella adiacensrepeat BC X 2 NGTD ID recommends 2 weeks of IV antibiotics and placement of picc line. Explain to family need for PICC line.  On penicillin. Needs 2 weeks of antibiotics. Last dose 11-03. stable for discharge   2-AKI; resolved with fluids.   Elevated D dimer;  VQ scan negative,.   Anemia; macrocytic anemia; iron deficiency.  baseline 12.  Low iron. Received IV iron.   Acute subcapital left femoral neck fracture.  Underwent sx 10-22. dvt prophylaxis per ortho. Lovenox  DM  SSI.   Trigeminal neuralgia;  IV dose phenytoin help.  Resume home dose tegretol, per wife dose is 200 mg BID.   BPH Continue Flomax  Hard of hearing.  Hematuria; monitor might be from trauma from foley.  No further evidence of bleeding.   Acute hypoxic respiratory failure.  NSL fluids.  Resolved with one dose of lasix.  Oxygen sat today 92 on room air.   Discharge Diagnoses:  Principal Problem:   Closed left hip fracture, initial encounter [facility]) Active Problems:   Fall   GERD (gastroesophageal reflux disease)   Diabetes mellitus without complication (HCC)   Trigeminal neuralgia   BPH (benign prostatic hyperplasia)   Closed left hip fracture [facility])    Discharge Instructions  Discharge Instructions    Diet - low sodium heart healthy   Complete by:  As directed    Home infusion instructions [facility] May follow [place] Dosing Protocol; May administer Cathflo as needed to maintain patency of vascular access device.; Flushing of vascular access device: per [facility] Protocol: 0.9% NaCl pre/post medica...   Complete by:  As directed    Instructions:  May follow [place]  Dosing Protocol   Instructions:  May administer Cathflo as needed to maintain patency of vascular access device.   Instructions:  Flushing of vascular access device: per [facility] Protocol: 0.9% NaCl pre/post  medication administration and prn patency; Heparin 100 u/ml, 62m for implanted ports and Heparin 10u/ml, 525mfor all other central venous catheters.   Instructions:  May follow [facility] Anaphylaxis Protocol for First Dose Administration in the home: 0.9% NaCl at 25-50 ml/hr to maintain IV access for protocol meds. Epinephrine 0.3 ml IV/IM PRN and Benadryl 25-50 IV/IM PRN s/s of anaphylaxis.   Instructions:  AdIdamaynfusion Coordinator (RN) to assist per patient IV care needs in the home PRN.   Increase activity slowly   Complete by:  As directed    Partial weight bearing   Complete by:  As directed    25% body weight, with supervision and with walker   Laterality:  left   Extremity:  Lower     Allergies as of 12/07/2017      Reactions   Aspirin Other (See Comments)   Per family aspirin causes him to bleed   Fentanyl Rash      Medication List    STOP taking these medications   carbamazepine 100 MG chewable tablet Commonly known as:  TEGRETOL Replaced by:  carbamazepine 200 MG tablet     TAKE these medications   acetaminophen 325 MG tablet Commonly known as:  TYLENOL Take 2 tablets (650 mg total) by mouth every 6 (six) hours as needed for mild pain (or Fever >/= 101).   carbamazepine 200 MG tablet Commonly known as:  TEGRETOL Take 1 tablet (200 mg total) by mouth 2 (two) times daily. Replaces:  carbamazepine 100 MG chewable tablet   cyanocobalamin 1000 MCG tablet Take 1 tablet (1,000 mcg total) by mouth daily. Start taking on:  12/08/2017   docusate sodium 100 MG capsule Commonly known as:  COLACE Take 100 mg by mouth daily.   enoxaparin 40 MG/0.4ML injection Commonly known as:  LOVENOX Inject 0.4 mLs (40 mg total) into the skin daily. 30 days post op   fluconazole 200 MG tablet Commonly known as:  DIFLUCAN Take 1 tablet (200 mg total) by mouth daily. Start taking on:  12/08/2017   gabapentin 300 MG capsule Commonly known as:  NEURONTIN Take 300 mg by  mouth at bedtime.   HYDROcodone-acetaminophen 5-325 MG tablet Commonly known as:  NORCO/VICODIN Take 1 tablet by mouth every 6 (six) hours as needed for severe pain.   Melatonin 5 MG Tabs Take 5 mg by mouth at bedtime.   metFORMIN 500 MG tablet Commonly known as:  GLUCOPHAGE Take 500 mg by mouth 2 (two) times daily with a meal.   omeprazole 20 MG capsule Commonly known as:  PRILOSEC Take 20 mg by mouth daily.   penicillin G  IVPB Inject 18 Million Units into the vein daily for 10 days. As continuous infusion  Indication:  Bacteremia Last Day of Therapy:  12/16/17 Labs - Once weekly:  CBC/D and BMP, Labs - Every other week:  ESR and CRP   polyethylene glycol packet Commonly known as:  MIRALAX / GLYCOLAX Take 17 g by mouth daily. Start taking on:  12/08/2017   psyllium 0.52 g capsule Commonly known as:  REGULOID Take 0.52 g by mouth daily.   senna 8.6 MG Tabs tablet Commonly known as:  SENOKOT Take 1 tablet (8.6 mg total) by mouth 2 (two) times daily.   tamsulosin 0.4 MG  Caps capsule Commonly known as:  FLOMAX Take 0.4 mg by mouth daily.            Home Infusion Instuctions  (From admission, onward)         Start     Ordered   12/07/17 0000  Home infusion instructions [facility] May follow [place] Dosing Protocol; May administer Cathflo as needed to maintain patency of vascular access device.; Flushing of vascular access device: per [facility] Protocol: 0.9% NaCl pre/post medica...    Question Answer Comment  Instructions May follow [place] Dosing Protocol   Instructions May administer Cathflo as needed to maintain patency of vascular access device.   Instructions Flushing of vascular access device: per [facility] Protocol: 0.9% NaCl pre/post medication administration and prn patency; Heparin 100 u/ml, 33m for implanted ports and Heparin 10u/ml, 53mfor all other central venous catheters.   Instructions May follow [facility] Anaphylaxis Protocol for First Dose  Administration in the home: 0.9% NaCl at 25-50 ml/hr to maintain IV access for protocol meds. Epinephrine 0.3 ml IV/IM PRN and Benadryl 25-50 IV/IM PRN s/s of anaphylaxis.   Instructions [facility] Infusion Coordinator (RN) to assist per patient IV care needs in the home PRN.      12/07/17 1415           Discharge Care Instructions  (From admission, onward)         Start     Ordered   12/04/17 0000  Partial weight bearing    Comments:  25% body weight, with supervision and with walker  Question Answer Comment  Laterality left   Extremity Lower      12/04/17 1850          Contact information for follow-up providers    NoNetta CedarsMD. Call in 2 weeks.   Specialty:  Orthopedic Surgery Why:  [phone] Contact information: [phone]          Contact information for after-discharge care    [facility] .   Service:  Skilled Nursing Contact information: [address] [phone]               Allergies  Allergen Reactions  . Aspirin Other (See Comments)    Per family aspirin causes him to bleed  . Fentanyl Rash    Consultations:  Orthopedic.    Procedures/Studies: Dg Chest 1 View  Result Date: 12/04/2017 CLINICAL DATA:  Cough. EXAM: CHEST  1 VIEW COMPARISON:  Chest radiograph December 02, 2017 FINDINGS: Cardiac silhouette is moderately enlarged and unchanged. Fullness of the pulmonary hila with pulmonary vascular congestion and interstitial prominence. New small LEFT pleural effusion with patchy bibasilar airspace opacities. Biapical pleural thickening. No pneumothorax. Soft tissue planes and included osseous structures are nonacute. IMPRESSION: Cardiomegaly and interstitial prominence consistent with pulmonary edema. Small LEFT pleural effusion and bibasilar airspace opacities. Electronically Signed   By:  CoElon Alas.D.   On: 12/04/2017 15:05   Dg Chest 2 View  Result Date: 12/02/2017 CLINICAL DATA:  Preop. EXAM: CHEST - 2 VIEW COMPARISON:  Radiographs of November 29, 2017. FINDINGS: Stable cardiomegaly. No pneumothorax is noted. Small bilateral pleural effusions are noted. Mild bibasilar subsegmental atelectasis is noted. Bony thorax is unremarkable. IMPRESSION: Mild bibasilar subsegmental atelectasis with small bilateral pleural effusions. Electronically Signed   By: JaMarijo ConceptionM.D.   On:  12/02/2017 15:30   Dg Chest 2 View  Result Date: 11/29/2017 CLINICAL DATA:  84 year old male status post fall with left hip fracture and hypoxia. Former smoker. EXAM: CHEST - 2 VIEW COMPARISON:  05/03/2017 FINDINGS: Cardiomegaly with tortuous atherosclerotic aorta. Pulmonary consolidation at the left base suspicious for pneumonia and/or atelectasis. Mild interstitial edema is noted. Subtle cortical discontinuity of the right mid and lower scapular cortex may represent subtle fractures. Correlate for pain and if needed dedicated scapular views are suggested. IMPRESSION: Cardiomegaly with pulmonary consolidation at the left lung base suspicious for atelectasis and/or pneumonia. Mild vascular congestion. Slight cortical irregularity of the mid and lower right scapula query fracture. Electronically Signed   By: Ashley Royalty M.D.   On: 11/29/2017 19:58   US Renal  Result Date: 12/02/2017 CLINICAL DATA:  Acute kidney injury. EXAM: RENAL / URINARY TRACT ULTRASOUND COMPLETE COMPARISON:  CT of the abdomen and pelvis 05/02/2017. FINDINGS: Right Kidney: Length: 10.3 cm, within normal limits. There is thinning of the renal parenchyma. No focal lesions are evident. Renal parenchyma is hypoechoic to the index organ, the liver. Left Kidney: Length: 10.4 cm, within normal limits. There is thinning of the renal parenchyma. No focal lesions are evident. Renal parenchyma is hypoechoic to the index organ, the spleen.  Bladder: A Foley catheter is in place. Multiple gallstones scratched at multiple shadowing gallstones are present without gallbladder wall thickening. IMPRESSION: 1. Thinning of the renal parenchyma bilaterally without focal lesion. Findings are compatible with medical renal disease. 2. No acute renal abnormality. 3. Foley catheter in situ. 4. Cholelithiasis. Electronically Signed   By: San Morelle M.D.   On: 12/02/2017 15:36   Dg Pelvis Portable  Result Date: 12/04/2017 CLINICAL DATA:  Pain in the left hip EXAM: PORTABLE PELVIS 1-2 VIEWS COMPARISON:  CT left hip 11/29/2017 and pelvis with left hip films of 11/29/2016 FINDINGS: The acute left subcapital femoral fracture demonstrated by CT is not well seen by plain film although there is slight cortical discontinuity in the left subcapital femur medially. No displacement of the fracture is seen. Both hip joint spaces are stable with more loss of hip joint space on the right. The pelvic rami are intact. The SI joints appear corticated. IMPRESSION: 1. The left femoral subcapital fracture demonstrated by CT is faintly visualized with no displacement. 2. No new or acute abnormality is seen. Electronically Signed   By: Ivar Drape M.D.   On: 12/04/2017 12:35   Ct Hip Left Wo Contrast  Result Date: 11/29/2017 CLINICAL DATA:  Patient fell onto left side while getting out of bed this morning and presents complaining of left hip pain. EXAM: CT OF THE LEFT HIP WITHOUT CONTRAST TECHNIQUE: Multidetector CT imaging of the left hip was performed according to the standard protocol. Multiplanar CT image reconstructions were also generated. COMPARISON:  Plain radiographs from earlier today. FINDINGS: Bones/Joint/Cartilage Acute, closed, slightly impacted subcapital left femoral neck fracture with slight impaction along the superolateral aspect of the femoral head-neck junction. No joint dislocation or effusion. The adjacent acetabulum and included pubic rami appear  intact. There is mild chondral thinning consistent with osteoarthritis. Ligaments Suboptimally assessed by CT. Muscles and Tendons Negative Soft tissues Soft tissue contusion along the lateral aspect of the left hip edema. No focal fluid collection to suggest hematoma. IMPRESSION: Acute subcapital left femoral neck fracture with slight impaction along the superolateral aspect of the femoral head-neck junction. Electronically Signed   By: Ashley Royalty M.D.   On: 11/29/2017 17:35  Nm Pulmonary Perf And Vent  Result Date: 12/02/2017 CLINICAL DATA:  PE suspected, intermediate probability, positive D-dimer. Left hip fracture. EXAM: NUCLEAR MEDICINE VENTILATION - PERFUSION LUNG SCAN TECHNIQUE: Ventilation images were obtained in multiple projections using inhaled aerosol Tc-43mDTPA. Perfusion images were obtained in multiple projections after intravenous injection of Tc-963mAA. RADIOPHARMACEUTICALS:  30.8 mCi of Tc-9924mPA aerosol inhalation and 4.28 mCi Tc99m3m IV COMPARISON:  Two-view chest x-ray of the same day. FINDINGS: Ventilation: No focal ventilation defect. There is some clumping of material at the perihilar regions bilaterally. No focal defects are evident. Perfusion: No wedge shaped peripheral perfusion defects to suggest acute pulmonary embolism. IMPRESSION: No pulmonary embolus. Electronically Signed   By: ChriSan Morelle.   On: 12/02/2017 15:22   Dg Knee Complete 4 Views Left  Result Date: 11/29/2017 CLINICAL DATA:  Left knee pain after fall EXAM: LEFT KNEE - COMPLETE 4+ VIEW COMPARISON:  None. FINDINGS: Mild-to-moderate femorotibial joint space narrowing. Trace joint effusion. No acute fracture or malalignment of the left knee. IMPRESSION: Degenerative joint space narrowing of the femorotibial compartment. No acute osseous abnormality. Electronically Signed   By: DaviAshley Royalty.   On: 11/29/2017 17:40   Dg C-arm 1-60 Min  Result Date: 12/04/2017 CLINICAL DATA:  Surgical  fixation of left femoral neck fracture EXAM: DG C-ARM 61-120 MIN; OPERATIVE LEFT HIP WITH PELVIS COMPARISON:  12/04/2017 pelvic radiograph FINDINGS: Fluoroscopy time 1 minutes 12 seconds. 2 spot fluoroscopic intraoperative left hip radiographs demonstrate transfixation of the subcapital left femoral neck fracture in anatomic alignment by 3 pins. IMPRESSION: Intraoperative fluoroscopic guidance for open surgical fixation of left femoral neck fracture. Electronically Signed   By: JasoIlona Sorrel.   On: 12/04/2017 20:36   Dg Hip Operative Unilat W Or W/o Pelvis Left  Result Date: 12/04/2017 CLINICAL DATA:  Surgical fixation of left femoral neck fracture EXAM: DG C-ARM 61-120 MIN; OPERATIVE LEFT HIP WITH PELVIS COMPARISON:  12/04/2017 pelvic radiograph FINDINGS: Fluoroscopy time 1 minutes 12 seconds. 2 spot fluoroscopic intraoperative left hip radiographs demonstrate transfixation of the subcapital left femoral neck fracture in anatomic alignment by 3 pins. IMPRESSION: Intraoperative fluoroscopic guidance for open surgical fixation of left femoral neck fracture. Electronically Signed   By: JasoIlona Sorrel.   On: 12/04/2017 20:36   Dg Hip Unilat With Pelvis 2-3 Views Left  Result Date: 11/29/2017 CLINICAL DATA:  [place] pain in the left hip. Limited range of motion. EXAM: DG HIP (WITH OR WITHOUT PELVIS) 2-3V LEFT COMPARISON:  None. FINDINGS: There is no evidence of hip fracture or dislocation. There is no evidence of arthropathy or other focal bone abnormality. IMPRESSION: Negative. Electronically Signed   By: MarkNelson Chimes.   On: 11/29/2017 16:18   Us [place] Site Rite  Result Date: 12/05/2017 If Site Rite image not attached, placement could not be confirmed due to current cardiac rhythm.     Subjective: Alert, report mild pain jaw.  Denies dyspnea.   Discharge Exam: Vitals:   12/06/17 2051 12/07/17 0423  BP: 128/75 133/69  Pulse: 62 (!) 53  Resp: 15 13  Temp: 97.6 F (36.4 C) 98.4 F  (36.9 C)  SpO2: 97% 92%   Vitals:   12/06/17 0404 12/06/17 1414 12/06/17 2051 12/07/17 0423  BP: 97/61 106/81 128/75 133/69  Pulse: (!) 54 60 62 (!) 53  Resp: _0 Temp: 97.6 F (36.4 C) (!) 97.5 F (36.4 C) 97.6 F (36.4 C) 98.4 F (36.9 C)  TempSrc: Oral Oral Oral Oral  SpO2: 93% 93% 97% 92%  Weight:      Height:        General: Pt is alert, awake, not in acute distress Cardiovascular: RRR, S1/S2 +, no rubs, no gallops Respiratory: CTA bilaterally, no wheezing, no rhonchi Abdominal: Soft, NT, ND, bowel sounds + Extremities: no edema, no cyanosis left LE with dressing.     The results of significant diagnostics from this hospitalization (including imaging, microbiology, ancillary and laboratory) are listed below for reference.     Microbiology: Recent Results (from the past 240 hour(s))  MRSA PCR Screening     Status: None   Collection Time: 11/29/17 11:55 PM  Result Value Ref Range Status   MRSA by PCR NEGATIVE NEGATIVE Final    Comment:        The GeneXpert MRSA Assay (FDA approved for NASAL specimens only), is one component of a comprehensive MRSA colonization surveillance program. It is not intended to diagnose MRSA infection nor to guide or monitor treatment for MRSA infections. Performed at [facility], [place] [address], [place], [place] [address]   Urine Culture     Status: Abnormal   Collection Time: 11/30/17  1:57 PM  Result Value Ref Range Status   Specimen Description URINE, CATHETERIZED  Final   Special Requests   Final    NONE Performed at [facility], [place] [address] [place], [place] [address]    Culture MULTIPLE SPECIES PRESENT, SUGGEST RECOLLECTION (A)  Final   Report Status 12/01/2017 FINAL  Final  Culture, blood (routine x 2)     Status: Abnormal   Collection Time: 11/30/17  2:14 PM  Result Value Ref Range Status   Specimen Description BLOOD BLOOD RIGHT FOREARM  Final   Special Requests   Final    BOTTLES DRAWN  AEROBIC AND ANAEROBIC Blood Culture results may not be optimal due to an excessive volume of blood received in culture bottles   Culture  Setup Time   Final    GRAM POSITIVE COCCI IN CLUSTERS IN BOTH AEROBIC AND ANAEROBIC BOTTLES CRITICAL RESULT CALLED TO, READ BACK BY AND VERIFIED WITH: L.SEAY,PHARMD AT 9449 ON 12/02/17 BY G.MCADOO Performed at [facility], [place] [address] [place], [place] [address]    Culture (A)  Final    GRANULICATELLA ADIACENS SUSCEPTIBILITIES PERFORMED ON PREVIOUS CULTURE WITHIN THE LAST 5 DAYS. STAPHYLOCOCCUS CAPITIS    Report Status 12/05/2017 FINAL  Final   Organism ID, Bacteria STAPHYLOCOCCUS CAPITIS  Final      Susceptibility   Staphylococcus capitis - MIC*    CIPROFLOXACIN 1 SENSITIVE Sensitive     ERYTHROMYCIN >=8 RESISTANT Resistant     GENTAMICIN <=0.5 SENSITIVE Sensitive     OXACILLIN <=0.25 SENSITIVE Sensitive     TETRACYCLINE <=1 SENSITIVE Sensitive     VANCOMYCIN <=0.5 SENSITIVE Sensitive     TRIMETH/SULFA <=10 SENSITIVE Sensitive     CLINDAMYCIN RESISTANT Resistant     RIFAMPIN <=0.5 SENSITIVE Sensitive     Inducible Clindamycin POSITIVE Resistant     * STAPHYLOCOCCUS CAPITIS  Blood Culture ID Panel (Reflexed)     Status: Abnormal   Collection Time: 11/30/17  2:14 PM  Result Value Ref Range Status   Enterococcus species NOT DETECTED NOT DETECTED Final   Listeria monocytogenes NOT DETECTED NOT DETECTED Final   Staphylococcus species DETECTED (A) NOT DETECTED Final    Comment: Methicillin (oxacillin) susceptible coagulase negative staphylococcus. Possible blood culture contaminant (unless isolated  from more than one blood culture draw or clinical case suggests pathogenicity). No antibiotic treatment is indicated for blood  culture contaminants. CRITICAL RESULT CALLED TO, READ BACK BY AND VERIFIED WITH: L.SEAY, PHARMD AT 0418 ON 12/02/17 BY G.MCADOO    Staphylococcus aureus (BCID) NOT DETECTED NOT DETECTED Final   Methicillin resistance  NOT DETECTED NOT DETECTED Final   Streptococcus species NOT DETECTED NOT DETECTED Final   Streptococcus agalactiae NOT DETECTED NOT DETECTED Final   Streptococcus pneumoniae NOT DETECTED NOT DETECTED Final   Streptococcus pyogenes NOT DETECTED NOT DETECTED Final   Acinetobacter baumannii NOT DETECTED NOT DETECTED Final   Enterobacteriaceae species NOT DETECTED NOT DETECTED Final   Enterobacter cloacae complex NOT DETECTED NOT DETECTED Final   Escherichia coli NOT DETECTED NOT DETECTED Final   Klebsiella oxytoca NOT DETECTED NOT DETECTED Final   Klebsiella pneumoniae NOT DETECTED NOT DETECTED Final   Proteus species NOT DETECTED NOT DETECTED Final   Serratia marcescens NOT DETECTED NOT DETECTED Final   Haemophilus influenzae NOT DETECTED NOT DETECTED Final   Neisseria meningitidis NOT DETECTED NOT DETECTED Final   Pseudomonas aeruginosa NOT DETECTED NOT DETECTED Final   Candida albicans NOT DETECTED NOT DETECTED Final   Candida glabrata NOT DETECTED NOT DETECTED Final   Candida krusei NOT DETECTED NOT DETECTED Final   Candida parapsilosis NOT DETECTED NOT DETECTED Final   Candida tropicalis NOT DETECTED NOT DETECTED Final    Comment: Performed at [facility], [place]. [address] [place], [place] [address]  Culture, blood (routine x 2)     Status: Abnormal   Collection Time: 11/30/17  2:19 PM  Result Value Ref Range Status   Specimen Description BLOOD BLOOD LEFT ARM  Final   Special Requests   Final    BOTTLES DRAWN AEROBIC AND ANAEROBIC Blood Culture adequate volume   Culture  Setup Time   Final    GRAM POSITIVE COCCI IN CLUSTERS IN BOTH AEROBIC AND ANAEROBIC BOTTLES CRITICAL VALUE NOTED.  VALUE IS CONSISTENT WITH PREVIOUSLY REPORTED AND CALLED VALUE.    Culture GRANULICATELLA ADIACENS (A)  Final   Report Status 12/04/2017 FINAL  Final   Organism ID, Bacteria GRANULICATELLA ADIACENS  Final      Susceptibility   Granulicatella adiacens - MIC*    CEFTRIAXONE 0.5 SENSITIVE  Sensitive     ERYTHROMYCIN <=0.12 SENSITIVE Sensitive     LEVOFLOXACIN <=0.25 SENSITIVE Sensitive     VANCOMYCIN Value in next row Sensitive      0.25 SENSITIVEPerformed at [place] [address] [place], [place] [address]    * GRANULICATELLA ADIACENS  Culture, blood (routine x 2)     Status: None   Collection Time: 12/02/17  3:33 PM  Result Value Ref Range Status   Specimen Description BLOOD LEFT ANTECUBITAL  Final   Special Requests   Final    BOTTLES DRAWN AEROBIC AND ANAEROBIC Blood Culture adequate volume   Culture   Final    NO GROWTH 5 DAYS Performed at [facility], [place] [address], [place], [place] [address]    Report Status 12/07/2017 FINAL  Final  Culture, blood (routine x 2)     Status: None   Collection Time: 12/02/17  3:39 PM  Result Value Ref Range Status   Specimen Description BLOOD LEFT ARM  Final   Special Requests   Final    BOTTLES DRAWN AEROBIC AND ANAEROBIC Blood Culture adequate volume   Culture   Final    NO  GROWTH 5 DAYS Performed at [facility], [place] [address] [place], [place] [address]    Report Status 12/07/2017 FINAL  Final     Labs: BNP (last 3 results) No results for input(s): BNP in the last 8760 hours. Basic Metabolic Panel: Recent Labs  Lab 12/02/17 0325 12/03/17 0535 12/04/17 2154 12/05/17 0200 12/06/17 0326 12/07/17 0427  NA 140 142  --  142 142 139  K 3.8 3.8  --  4.3 3.9 3.8  CL 112* 116*  --  117* 117* 111  CO2 21* 21*  --  18* 20* 23  GLUCOSE 169* 139*  --  200* 160* 137*  BUN 38* 36*  --  29* 27* 24*  CREATININE 1.34* 1.00 0.97 0.99 0.93 0.92  CALCIUM 7.8* 7.6*  --  7.9* 7.7* 7.8*   Liver Function Tests: Recent Labs  Lab 12/01/17 0829  AST 27  ALT 42  ALKPHOS 91  BILITOT 1.1  PROT 5.6*  ALBUMIN 2.6*   No results for input(s): LIPASE, AMYLASE in the last 168 hours. No results for input(s): AMMONIA in the last 168 hours. CBC: Recent Labs  Lab 12/02/17 0325 12/03/17 0535 12/04/17 2154  12/05/17 0200 12/06/17 0326 12/07/17 0757  WBC 12.0* 7.6 9.8 9.8 11.7* 11.4*  NEUTROABS 10.5* 5.1  --  7.4  --   --   HGB 9.7* 9.4* 11.0* 10.2* 9.7* 11.5*  HCT 29.2* 28.9* 34.3* 32.6* 30.3* 34.7*  MCV 113.6* 115.1* 113.6* 115.6* 114.3* 113.0*  PLT 142* 132* 168 144* 158 202   Cardiac Enzymes: Recent Labs  Lab 12/01/17 0829  CKTOTAL 66   BNP: Invalid input(s): POCBNP CBG: Recent Labs  Lab 12/06/17 1207 12/06/17 1625 12/06/17 2057 12/07/17 0645 12/07/17 1220  GLUCAP 125* 185* 83 131* 133*   D-Dimer No results for input(s): DDIMER in the last 72 hours. Hgb A1c No results for input(s): HGBA1C in the last 72 hours. Lipid Profile No results for input(s): CHOL, HDL, LDLCALC, TRIG, CHOLHDL, LDLDIRECT in the last 72 hours. Thyroid function studies No results for input(s): TSH, T4TOTAL, T3FREE, THYROIDAB in the last 72 hours.  Invalid input(s): FREET3 Anemia work up No results for input(s): VITAMINB12, FOLATE, FERRITIN, TIBC, IRON, RETICCTPCT in the last 72 hours. Urinalysis    Component Value Date/Time   COLORURINE AMBER (A) 11/30/2017 1357   APPEARANCEUR CLOUDY (A) 11/30/2017 1357   LABSPEC 1.013 11/30/2017 1357   PHURINE 7.0 11/30/2017 1357   GLUCOSEU NEGATIVE 11/30/2017 1357   HGBUR LARGE (A) 11/30/2017 1357   BILIRUBINUR NEGATIVE 11/30/2017 1357   KETONESUR NEGATIVE 11/30/2017 1357   PROTEINUR 30 (A) 11/30/2017 1357   NITRITE NEGATIVE 11/30/2017 1357   LEUKOCYTESUR TRACE (A) 11/30/2017 1357   Sepsis Labs Invalid input(s): PROCALCITONIN,  WBC,  LACTICIDVEN Microbiology Recent Results (from the past 240 hour(s))  MRSA PCR Screening     Status: None   Collection Time: 11/29/17 11:55 PM  Result Value Ref Range Status   MRSA by PCR NEGATIVE NEGATIVE Final    Comment:        The GeneXpert MRSA Assay (FDA approved for NASAL specimens only), is one component of a comprehensive MRSA colonization surveillance program. It is not intended to diagnose  MRSA infection nor to guide or monitor treatment for MRSA infections. Performed at [facility], [place] [address], [place], [place] [address]   Urine Culture     Status: Abnormal   Collection Time: 11/30/17  1:57 PM  Result Value Ref Range Status  Specimen Description URINE, CATHETERIZED  Final   Special Requests   Final    NONE Performed at [facility], [place] [address], [place], [place] [address]    Culture MULTIPLE SPECIES PRESENT, SUGGEST RECOLLECTION (A)  Final   Report Status 12/01/2017 FINAL  Final  Culture, blood (routine x 2)     Status: Abnormal   Collection Time: 11/30/17  2:14 PM  Result Value Ref Range Status   Specimen Description BLOOD BLOOD RIGHT FOREARM  Final   Special Requests   Final    BOTTLES DRAWN AEROBIC AND ANAEROBIC Blood Culture results may not be optimal due to an excessive volume of blood received in culture bottles   Culture  Setup Time   Final    GRAM POSITIVE COCCI IN CLUSTERS IN BOTH AEROBIC AND ANAEROBIC BOTTLES CRITICAL RESULT CALLED TO, READ BACK BY AND VERIFIED WITH: L.SEAY,PHARMD AT 4259 ON 12/02/17 BY G.[email or web address] Performed at [facility], [place] [address], [place], [place] [address]    Culture (A)  Final    GRANULICATELLA ADIACENS SUSCEPTIBILITIES PERFORMED ON PREVIOUS CULTURE WITHIN THE LAST 5 DAYS. STAPHYLOCOCCUS CAPITIS    Report Status 12/05/2017 FINAL  Final   Organism ID, Bacteria STAPHYLOCOCCUS CAPITIS  Final      Susceptibility   Staphylococcus capitis - MIC*    CIPROFLOXACIN 1 SENSITIVE Sensitive     ERYTHROMYCIN >=8 RESISTANT Resistant     GENTAMICIN <=0.5 SENSITIVE Sensitive     OXACILLIN <=0.25 SENSITIVE Sensitive     TETRACYCLINE <=1 SENSITIVE Sensitive     VANCOMYCIN <=0.5 SENSITIVE Sensitive     TRIMETH/SULFA <=10 SENSITIVE Sensitive     CLINDAMYCIN RESISTANT Resistant     RIFAMPIN <=0.5 SENSITIVE Sensitive     Inducible Clindamycin POSITIVE Resistant     * STAPHYLOCOCCUS CAPITIS  Blood Culture ID Panel  (Reflexed)     Status: Abnormal   Collection Time: 11/30/17  2:14 PM  Result Value Ref Range Status   Enterococcus species NOT DETECTED NOT DETECTED Final   Listeria monocytogenes NOT DETECTED NOT DETECTED Final   Staphylococcus species DETECTED (A) NOT DETECTED Final    Comment: Methicillin (oxacillin) susceptible coagulase negative staphylococcus. Possible blood culture contaminant (unless isolated from more than one blood culture draw or clinical case suggests pathogenicity). No antibiotic treatment is indicated for blood  culture contaminants. CRITICAL RESULT CALLED TO, READ BACK BY AND VERIFIED WITH: L.SEAY, PHARMD AT 0418 ON 12/02/17 BY G.MCADOO    Staphylococcus aureus (BCID) NOT DETECTED NOT DETECTED Final   Methicillin resistance NOT DETECTED NOT DETECTED Final   Streptococcus species NOT DETECTED NOT DETECTED Final   Streptococcus agalactiae NOT DETECTED NOT DETECTED Final   Streptococcus pneumoniae NOT DETECTED NOT DETECTED Final   Streptococcus pyogenes NOT DETECTED NOT DETECTED Final   Acinetobacter baumannii NOT DETECTED NOT DETECTED Final   Enterobacteriaceae species NOT DETECTED NOT DETECTED Final   Enterobacter cloacae complex NOT DETECTED NOT DETECTED Final   Escherichia coli NOT DETECTED NOT DETECTED Final   Klebsiella oxytoca NOT DETECTED NOT DETECTED Final   Klebsiella pneumoniae NOT DETECTED NOT DETECTED Final   Proteus species NOT DETECTED NOT DETECTED Final   Serratia marcescens NOT DETECTED NOT DETECTED Final   Haemophilus influenzae NOT DETECTED NOT DETECTED Final   Neisseria meningitidis NOT DETECTED NOT DETECTED Final   Pseudomonas aeruginosa NOT DETECTED NOT DETECTED Final   Candida albicans NOT DETECTED NOT DETECTED Final   Candida glabrata NOT DETECTED NOT DETECTED Final   Candida krusei NOT  DETECTED NOT DETECTED Final   Candida parapsilosis NOT DETECTED NOT DETECTED Final   Candida tropicalis NOT DETECTED NOT DETECTED Final    Comment: Performed at  [facility], [place] [address], [place], [place] [address]  Culture, blood (routine x 2)     Status: Abnormal   Collection Time: 11/30/17  2:19 PM  Result Value Ref Range Status   Specimen Description BLOOD BLOOD LEFT ARM  Final   Special Requests   Final    BOTTLES DRAWN AEROBIC AND ANAEROBIC Blood Culture adequate volume   Culture  Setup Time   Final    GRAM POSITIVE COCCI IN CLUSTERS IN BOTH AEROBIC AND ANAEROBIC BOTTLES CRITICAL VALUE NOTED.  VALUE IS CONSISTENT WITH PREVIOUSLY REPORTED AND CALLED VALUE.    Culture GRANULICATELLA ADIACENS (A)  Final   Report Status 12/04/2017 FINAL  Final   Organism ID, Bacteria GRANULICATELLA ADIACENS  Final      Susceptibility   Granulicatella adiacens - MIC*    CEFTRIAXONE 0.5 SENSITIVE Sensitive     ERYTHROMYCIN <=0.12 SENSITIVE Sensitive     LEVOFLOXACIN <=0.25 SENSITIVE Sensitive     VANCOMYCIN Value in next row Sensitive      0.25 SENSITIVEPerformed at [place] [address], [place], [place] [address]    * GRANULICATELLA ADIACENS  Culture, blood (routine x 2)     Status: None   Collection Time: 12/02/17  3:33 PM  Result Value Ref Range Status   Specimen Description BLOOD LEFT ANTECUBITAL  Final   Special Requests   Final    BOTTLES DRAWN AEROBIC AND ANAEROBIC Blood Culture adequate volume   Culture   Final    NO GROWTH 5 DAYS Performed at [facility], [place] [address], [place], [place] [address]    Report Status 12/07/2017 FINAL  Final  Culture, blood (routine x 2)     Status: None   Collection Time: 12/02/17  3:39 PM  Result Value Ref Range Status   Specimen Description BLOOD LEFT ARM  Final   Special Requests   Final    BOTTLES DRAWN AEROBIC AND ANAEROBIC Blood Culture adequate volume   Culture   Final    NO GROWTH 5 DAYS Performed at [place] [address] [place], [place] [address]    Report Status 12/07/2017 FINAL  Final     Time coordinating discharge: 35 minutes.    SIGNED:   Elmarie Shiley, MD  [facility] 12/07/2017, 2:16 PM Pager   If 7PM-7AM, please contact night-coverage [email or web address] Password [email or web address]

## 2017-12-07 NOTE — Progress Notes (Addendum)
   Subjective: 3 Days Post-Op Procedure(s) (LRB): PERCUTANEOUS CANNULATED SCREW FIXATION LEFT HIP (Left)  Pt resting comfortably in no acute distress D/c planning Denies any pain to left hip currently Patient reports pain as none.  Objective:   VITALS:   Vitals:   12/06/17 2051 12/07/17 0423  BP: 128/75 133/69  Pulse: 62 (!) 53  Resp: 15 13  Temp: 97.6 F (36.4 C) 98.4 F (36.9 C)  SpO2: 97% 92%    Left hip incision healing well nv intact distally No rashes or edema  LABS Recent Labs    12/05/17 0200 12/06/17 0326 12/07/17 0757  HGB 10.2* 9.7* 11.5*  HCT 32.6* 30.3* 34.7*  WBC 9.8 11.7* 11.4*  PLT 144* 158 202    Recent Labs    12/05/17 0200 12/06/17 0326 12/07/17 0427  NA 142 142 139  K 4.3 3.9 3.8  BUN 29* 27* 24*  CREATININE 0.99 0.93 0.92  GLUCOSE 200* 160* 137*     Assessment/Plan: 3 Days Post-Op Procedure(s) (LRB): PERCUTANEOUS CANNULATED SCREW FIXATION LEFT HIP (Left) D/c planning to SNF per medical team Stable from orthopedic standpoint Pain management as needed  DVT prophylaxis - Lovenox and mechanical    Terry Harrell [place], MPAS [facility] is now [employer] [address] [place], [place], [place] [address] Phone: [phone] [email or web address] Facebook  [employer]

## 2017-12-07 NOTE — Progress Notes (Signed)
Physical Therapy Treatment Patient Details Name: Terry Harrell MRN: [id number] DOB: Sep 19, 1927 Today's Date: 12/07/2017    History of Present Illness 84yo male c/o pain after fall at home, found to have sustained L subcapital femoral neck fracture. He received cannulated screw fixation L hip on 10/22. PMH acoustic neuroma, DM, neuropathy, spinal stenosis, hx brain surgery, HOH     PT Comments    Patient seen for mobility progression. Pt is pleasant and participatory during session. Pt requires +2 assist for bed mobility and functional transfers. Time in standing limited due to pt's difficulty with maintaining PWB L LE. Pt c/o L jaw pain which appeared to bother him more than his L LE. Continue to progress as tolerated with anticipated d/c to SNF for further skilled PT services.     Follow Up Recommendations  SNF     Equipment Recommendations  Other (comment)(defer to next venue )    Recommendations for Other Services       Precautions / Restrictions Precautions Precautions: Fall Precaution Comments: impaired cognition and communication; max difficulty with PWB  Restrictions Weight Bearing Restrictions: Yes LLE Weight Bearing: Non weight bearing LLE Partial Weight Bearing Percentage or Pounds: 25%     Mobility  Bed Mobility Overal bed mobility: Needs Assistance Bed Mobility: Supine to Sit;Sit to Supine     Supine to sit: Min assist;+2 for physical assistance;HOB elevated Sit to supine: Mod assist;+2 for physical assistance   General bed mobility comments: cues for sequencing; assist to bring L LE to EOB and to elevate trunk into sitting  Transfers Overall transfer level: Needs assistance Equipment used: Rolling walker (2 wheeled) Transfers: Sit to/from Stand Sit to Stand: Max assist;+2 physical assistance;+2 safety/equipment         General transfer comment: assistance to power up into standing and for balance upon standing; facilitation at glutes for hip extension;  time in standing limited due to inability to maintain weight bearing precautions   Ambulation/Gait             General Gait Details: unable to attempt; pt unable to maintain PWB status in standing    Stairs             Wheelchair Mobility    Modified Rankin (Stroke Patients Only)       Balance Overall balance assessment: Needs assistance Sitting-balance support: Feet supported;Bilateral upper extremity supported Sitting balance-Leahy Scale: Poor   Postural control: Posterior lean   Standing balance-Leahy Scale: Zero(could not stand and maintain WB precautions )                              Cognition Arousal/Alertness: Awake/alert Behavior During Therapy: WFL for tasks assessed/performed Overall Cognitive Status: Within Functional Limits for tasks assessed                                 General Comments: pt follows commands consistently for tasks performed during session and answering questions appropriately       Exercises      General Comments        Pertinent Vitals/Pain Pain Assessment: Faces Faces Pain Scale: (6-8) Pain Location: L jaw to the touch and L hip with mobility (jaw > hip) Pain Descriptors / Indicators: Grimacing;Guarding;Moaning;Sore;Aching Pain Intervention(s): Limited activity within patient's tolerance;Monitored during session;Repositioned;Patient requesting pain meds-RN notified;RN gave pain meds during session    Home Living  Prior Function            PT Goals (current goals can now be found in the care plan section) Progress towards PT goals: Progressing toward goals    Frequency    Min 2X/week      PT Plan Current plan remains appropriate    Co-evaluation PT/OT/SLP Co-Evaluation/Treatment: Yes            AM-PAC PT "6 Clicks" Daily Activity  Outcome Measure  Difficulty turning over in bed (including adjusting bedclothes, sheets and blankets)?:  Unable Difficulty moving from lying on back to sitting on the side of the bed? : Unable Difficulty sitting down on and standing up from a chair with arms (e.g., wheelchair, bedside commode, etc,.)?: Unable Help needed moving to and from a bed to chair (including a wheelchair)?: A Lot Help needed walking in hospital room?: Total Help needed climbing 3-5 steps with a railing? : Total 6 Click Score: 7    End of Session Equipment Utilized During Treatment: Gait belt Activity Tolerance: Patient tolerated treatment well Patient left: in bed;with call bell/phone within reach;with bed alarm set Nurse Communication: Mobility status PT Visit Diagnosis: Unsteadiness on feet (R26.81);Difficulty in walking, not elsewhere classified (R26.2);Muscle weakness (generalized) (M62.81);History of falling (Z91.81)     Time: 3903-0092 PT Time Calculation (min) (ACUTE ONLY): 27 min  Charges:  $Therapeutic Activity: 23-37 mins                     Earney Navy, PTA Acute Rehabilitation Services Pager: [phone] Office: [phone]     Darliss Cheney 12/07/2017, 11:55 AM

## 2017-12-07 NOTE — Progress Notes (Signed)
Patient will DC to: [facility] Anticipated DC date: 12/07/17 Family notified:Carolyn (spouse) Transport by: Corey Harold  Per MD patient ready for DC to [facility] . RN, patient, patient's family, and facility notified of DC. Discharge Summary sent to facility. RN given number for report [phone]. DC packet on chart. Ambulance transport requested for patient.  CSW signing off.  [place], [place]

## 2017-12-08 DIAGNOSIS — R7881 Bacteremia: Secondary | ICD-10-CM

## 2017-12-08 DIAGNOSIS — D72829 Elevated white blood cell count, unspecified: Secondary | ICD-10-CM

## 2017-12-08 DIAGNOSIS — R509 Fever, unspecified: Secondary | ICD-10-CM

## 2017-12-10 ENCOUNTER — Encounter (HOSPITAL_COMMUNITY): Payer: Self-pay | Admitting: Orthopedic Surgery

## 2017-12-11 ENCOUNTER — Encounter (HOSPITAL_COMMUNITY): Payer: Self-pay | Admitting: Orthopedic Surgery

## 2017-12-18 ENCOUNTER — Encounter: Payer: Self-pay | Admitting: Family

## 2017-12-18 ENCOUNTER — Ambulatory Visit (INDEPENDENT_AMBULATORY_CARE_PROVIDER_SITE_OTHER): Payer: Medicare Other | Admitting: Family

## 2017-12-18 VITALS — Temp 98.3°F

## 2017-12-18 DIAGNOSIS — S72002S Fracture of unspecified part of neck of left femur, sequela: Secondary | ICD-10-CM | POA: Diagnosis not present

## 2017-12-18 DIAGNOSIS — R7881 Bacteremia: Secondary | ICD-10-CM | POA: Diagnosis not present

## 2017-12-18 NOTE — Patient Instructions (Signed)
Nice to see you.  We will check your blood work today.   We will discontinue your PICC line.  Hold off on any additional antibiotics at this time.   Follow up with Dr. Veverly Fells as scheduled.   Follow up with ID pending blood work results.

## 2017-12-18 NOTE — Progress Notes (Signed)
Subjective:    Patient ID: Terry Harrell, male    DOB: 1927-08-13, 84 y.o.   MRN: [id number]  Chief Complaint  Patient presents with  . Bacteremia    HPI:  Terry Harrell is a 84 y.o. male who presents today for initial office visit follow hospitalization for left hip fracture and Staphylococcus capitis/Granulicatella bacteremia.  [name] was admitted to the hospital following a fall in his bathroom resulting in a fall. Initially was scheduled to have surgery, however found to be febrile at 103. Blood cultures were positive for Staphylococcus capitus and Granulicatella bacteremia that were sensitive to penicillin. He underwent percutaenous cannulated screw fixation on 10/22. Repeat blood cultures were negative. He was scheduled to receive penicillin through 12/16/17 and have his PICC line removed. He was discharged to skilled nursing. All hospital labs, records, and imaging were reviewed in detail.   [name] has completed his penicillin as prescribed with no adverse side effects. Family indicate he is not feeling overly well today. Denies fevers,chills or sweats. Physical therapy remains limited secondary to restrictions from surgery. He has follow up with Dr. Veverly Fells tomorrow. PICC line currently remains in place. He continues to have pain located in his left lower extremity.    Allergies  Allergen Reactions  . Aspirin Other (See Comments)    Per family aspirin causes him to bleed  . Fentanyl Rash      Outpatient Medications Prior to Visit  Medication Sig Dispense Refill  . acetaminophen (TYLENOL) 325 MG tablet Take 2 tablets (650 mg total) by mouth every 6 (six) hours as needed for mild pain (or Fever >/= 101).    . carbamazepine (TEGRETOL) 200 MG tablet Take 1 tablet (200 mg total) by mouth 2 (two) times daily. 60 tablet 0  . docusate sodium (COLACE) 100 MG capsule Take 100 mg by mouth daily.    Marland Kitchen enoxaparin (LOVENOX) 40 MG/0.4ML injection Inject 0.4 mLs (40 mg total) into the skin  daily. 30 days post op 30 mL 0  . fluconazole (DIFLUCAN) 200 MG tablet Take 1 tablet (200 mg total) by mouth daily. 10 tablet 0  . gabapentin (NEURONTIN) 300 MG capsule Take 300 mg by mouth at bedtime.    Marland Kitchen HYDROcodone-acetaminophen (NORCO/VICODIN) 5-325 MG tablet Take 1 tablet by mouth every 6 (six) hours as needed for severe pain. (Patient not taking: Reported on 11/30/2017) 20 tablet 0  . Melatonin 5 MG TABS Take 5 mg by mouth at bedtime.    . metFORMIN (GLUCOPHAGE) 500 MG tablet Take 500 mg by mouth 2 (two) times daily with a meal.    . omeprazole (PRILOSEC) 20 MG capsule Take 20 mg by mouth daily.    . polyethylene glycol (MIRALAX / GLYCOLAX) packet Take 17 g by mouth daily. 14 each 0  . psyllium (REGULOID) 0.52 g capsule Take 0.52 g by mouth daily.    Marland Kitchen senna (SENOKOT) 8.6 MG TABS tablet Take 1 tablet (8.6 mg total) by mouth 2 (two) times daily. 120 each 0  . tamsulosin (FLOMAX) 0.4 MG CAPS capsule Take 0.4 mg by mouth daily.     . vitamin B-12 1000 MCG tablet Take 1 tablet (1,000 mcg total) by mouth daily. 20 tablet 0   No facility-administered medications prior to visit.      Past Medical History:  Diagnosis Date  . Acoustic neuroma ([place])   . Barretts esophagus   . Depression   . Diabetes mellitus without complication ([place])   . Edema   .  GERD (gastroesophageal reflux disease)   . Kidney infection   . Neuropathy   . Spinal stenosis   . Trigeminal neuralgia       Past Surgical History:  Procedure Laterality Date  . APPENDECTOMY    . BRAIN SURGERY    . HERNIA REPAIR    . HIP PINNING,CANNULATED Left 12/04/2017   Procedure: PERCUTANEOUS CANNULATED SCREW FIXATION LEFT HIP;  Surgeon: Netta Cedars, MD;  Location: [facility];  Service: Orthopedics;  Laterality: Left;      Family History  Problem Relation Age of Onset  . Hypertension Other       Social History   Socioeconomic History  . Marital status: Married    Spouse name: Not on file  . Number of children: Not on  file  . Years of education: Not on file  . Highest education level: Not on file  Occupational History  . Not on file  Social Needs  . Financial resource strain: Not on file  . Food insecurity:    Worry: Not on file    Inability: Not on file  . Transportation needs:    Medical: Not on file    Non-medical: Not on file  Tobacco Use  . Smoking status: Former Research scientist (life sciences)  . Smokeless tobacco: Never Used  Substance and Sexual Activity  . Alcohol use: No    Frequency: Never  . Drug use: No  . Sexual activity: Not on file  Lifestyle  . Physical activity:    Days per week: Not on file    Minutes per session: Not on file  . Stress: Not on file  Relationships  . Social connections:    Talks on phone: Not on file    Gets together: Not on file    Attends religious service: Not on file    Active member of club or organization: Not on file    Attends meetings of clubs or organizations: Not on file    Relationship status: Not on file  . Intimate partner violence:    Fear of current or ex partner: Not on file    Emotionally abused: Not on file    Physically abused: Not on file    Forced sexual activity: Not on file  Other Topics Concern  . Not on file  Social History Narrative  . Not on file      Review of Systems  Constitutional: Positive for appetite change. Negative for chills, fever and unexpected weight change.  Respiratory: Negative for chest tightness and shortness of breath.   Cardiovascular: Negative for chest pain.  Gastrointestinal: Negative for abdominal pain, constipation, diarrhea, nausea and vomiting.  Musculoskeletal:       Positive for left thigh pain.        Objective:    Temp 98.3 F (36.8 C) (Oral)  Nursing note and vital signs reviewed.  Physical Exam  Constitutional: He appears well-developed. No distress.  Cardiovascular: Normal rate, regular rhythm, normal heart sounds and intact distal pulses.  PICC line dressing is intact, clean and dry. There is  no evidence of infection.   Pulmonary/Chest: Effort normal and breath sounds normal.  Neurological: He is alert.  Skin: Skin is warm and dry.  Surgical dressing is clean and dry.         Assessment & Plan:   Problem List Items Addressed This Visit      Musculoskeletal and Integument   Closed left hip fracture [facility])    Status post surgical repair during hospitalization and appears  stable at present. Continue follow up and therapy per Dr. Veverly Fells.         Other   Bacteremia - Primary    Mr. Congrove has completed 2 weeks of IV penicillin for Staphylococcus capitus/Granulicatella bacteremia without adverse side effects or missed doses. Not feeling overly well today, however he does not appear to have any infections. Check CBC, CMP, and blood cultures. Will send order to have PICC removed. Continue to monitor off antibiotics at this time pending lab work results. Follow up as needed pending results as well.       Relevant Orders   Culture, blood (single)   Culture, blood (single)   Comprehensive metabolic panel (Completed)   CBC (Completed)   Sedimentation rate (Completed)   C-reactive protein (Completed)       I am having Contrell Neaves maintain his metFORMIN, omeprazole, tamsulosin, psyllium, docusate sodium, HYDROcodone-acetaminophen, acetaminophen, gabapentin, Melatonin, enoxaparin, carbamazepine, fluconazole, polyethylene glycol, senna, and cyanocobalamin.    Follow-up: Return if symptoms worsen or fail to improve.    Terri Piedra, MSN, FNP-C Nurse Practitioner [facility] Office phone: [phone] Pager: [place] number: [phone]

## 2017-12-19 ENCOUNTER — Encounter: Payer: Self-pay | Admitting: Family

## 2017-12-19 NOTE — Assessment & Plan Note (Signed)
Status post surgical repair during hospitalization and appears stable at present. Continue follow up and therapy per Dr. Veverly Fells.

## 2017-12-19 NOTE — Assessment & Plan Note (Addendum)
Terry Harrell has completed 2 weeks of IV penicillin for Staphylococcus capitus/Granulicatella bacteremia without adverse side effects or missed doses. Not feeling overly well today, however he does not appear to have any infections. Check CBC, CMP, and blood cultures. Will send order to have PICC removed. Continue to monitor off antibiotics at this time pending lab work results. Follow up as needed pending results as well.

## 2017-12-24 ENCOUNTER — Telehealth: Payer: Self-pay | Admitting: *Deleted

## 2017-12-24 NOTE — Telephone Encounter (Signed)
Patient's daughter calling for lab results, next step. Please advise on results, and if he needs to return to [facility]. Landis Gandy, RN

## 2017-12-24 NOTE — Telephone Encounter (Signed)
Please inform Terry Harrell that his blood work looks okay with no clear evidence of infection. Therefore it does not appear that he is in need of additional antibiotics at this time as he does not have infections in his blood stream. Continue to follow up with primary care and orthopedics if he does not feel well going forward.

## 2017-12-25 LAB — CULTURE BLOOD MANUAL
MICRO NUMBER 7002: 91335830
MICRO NUMBER 7002: 91335831
Result: NO GROWTH
Result: NO GROWTH
SPECIMEN QUALITY 12: ADEQUATE
Specimen Quality: ADEQUATE

## 2017-12-25 LAB — COMPREHENSIVE METABOLIC PANEL
AG RATIO: 1.1 (calc) (ref 1.0–2.5)
ALKALINE PHOSPHATASE (APISO): 202 U/L — AB (ref 40–115)
ALT: 25 U/L (ref 9–46)
AST: 24 U/L (ref 10–35)
Albumin: 3 g/dL — ABNORMAL LOW (ref 3.6–5.1)
BUN: 25 mg/dL (ref 7–25)
CALCIUM: 8.3 mg/dL — AB (ref 8.6–10.3)
CHLORIDE: 108 mmol/L (ref 98–110)
CO2: 21 mmol/L (ref 20–32)
Creat: 0.75 mg/dL (ref 0.70–1.11)
GLOBULIN: 2.8 g/dL (ref 1.9–3.7)
GLUCOSE: 134 mg/dL — AB (ref 65–99)
Potassium: 3.9 mmol/L (ref 3.5–5.3)
Sodium: 138 mmol/L (ref 135–146)
Total Bilirubin: 0.6 mg/dL (ref 0.2–1.2)
Total Protein: 5.8 g/dL — ABNORMAL LOW (ref 6.1–8.1)

## 2017-12-25 LAB — CBC
HCT: 32.9 % — ABNORMAL LOW (ref 38.5–50.0)
HEMOGLOBIN: 11.6 g/dL — AB (ref 13.2–17.1)
MCH: 37.8 pg — ABNORMAL HIGH (ref 27.0–33.0)
MCHC: 35.3 g/dL (ref 32.0–36.0)
MCV: 107.2 fL — ABNORMAL HIGH (ref 80.0–100.0)
MPV: 12.3 fL (ref 7.5–12.5)
Platelets: 220 10*3/uL (ref 140–400)
RBC: 3.07 10*6/uL — ABNORMAL LOW (ref 4.20–5.80)
RDW: 14.1 % (ref 11.0–15.0)
WBC: 8.9 10*3/uL (ref 3.8–10.8)

## 2017-12-25 LAB — SEDIMENTATION RATE: SED RATE: 80 mm/h — AB (ref 0–20)

## 2017-12-25 LAB — C-REACTIVE PROTEIN: CRP: 32.5 mg/L — AB (ref <8.0)

## 2017-12-26 NOTE — Telephone Encounter (Signed)
Left message on machine with Greg's advice.

## 2018-11-14 DEATH — deceased

## 2019-12-17 IMAGING — DX DG CHEST 2V
2 series · 2 of 2 positions shown · non-contrast
Comparison: 05/03/2017

CLINICAL DATA: 82-year-old male status post fall with left hip
fracture and hypoxia. Former smoker.

EXAM:
CHEST - 2 VIEW

[chest lat]
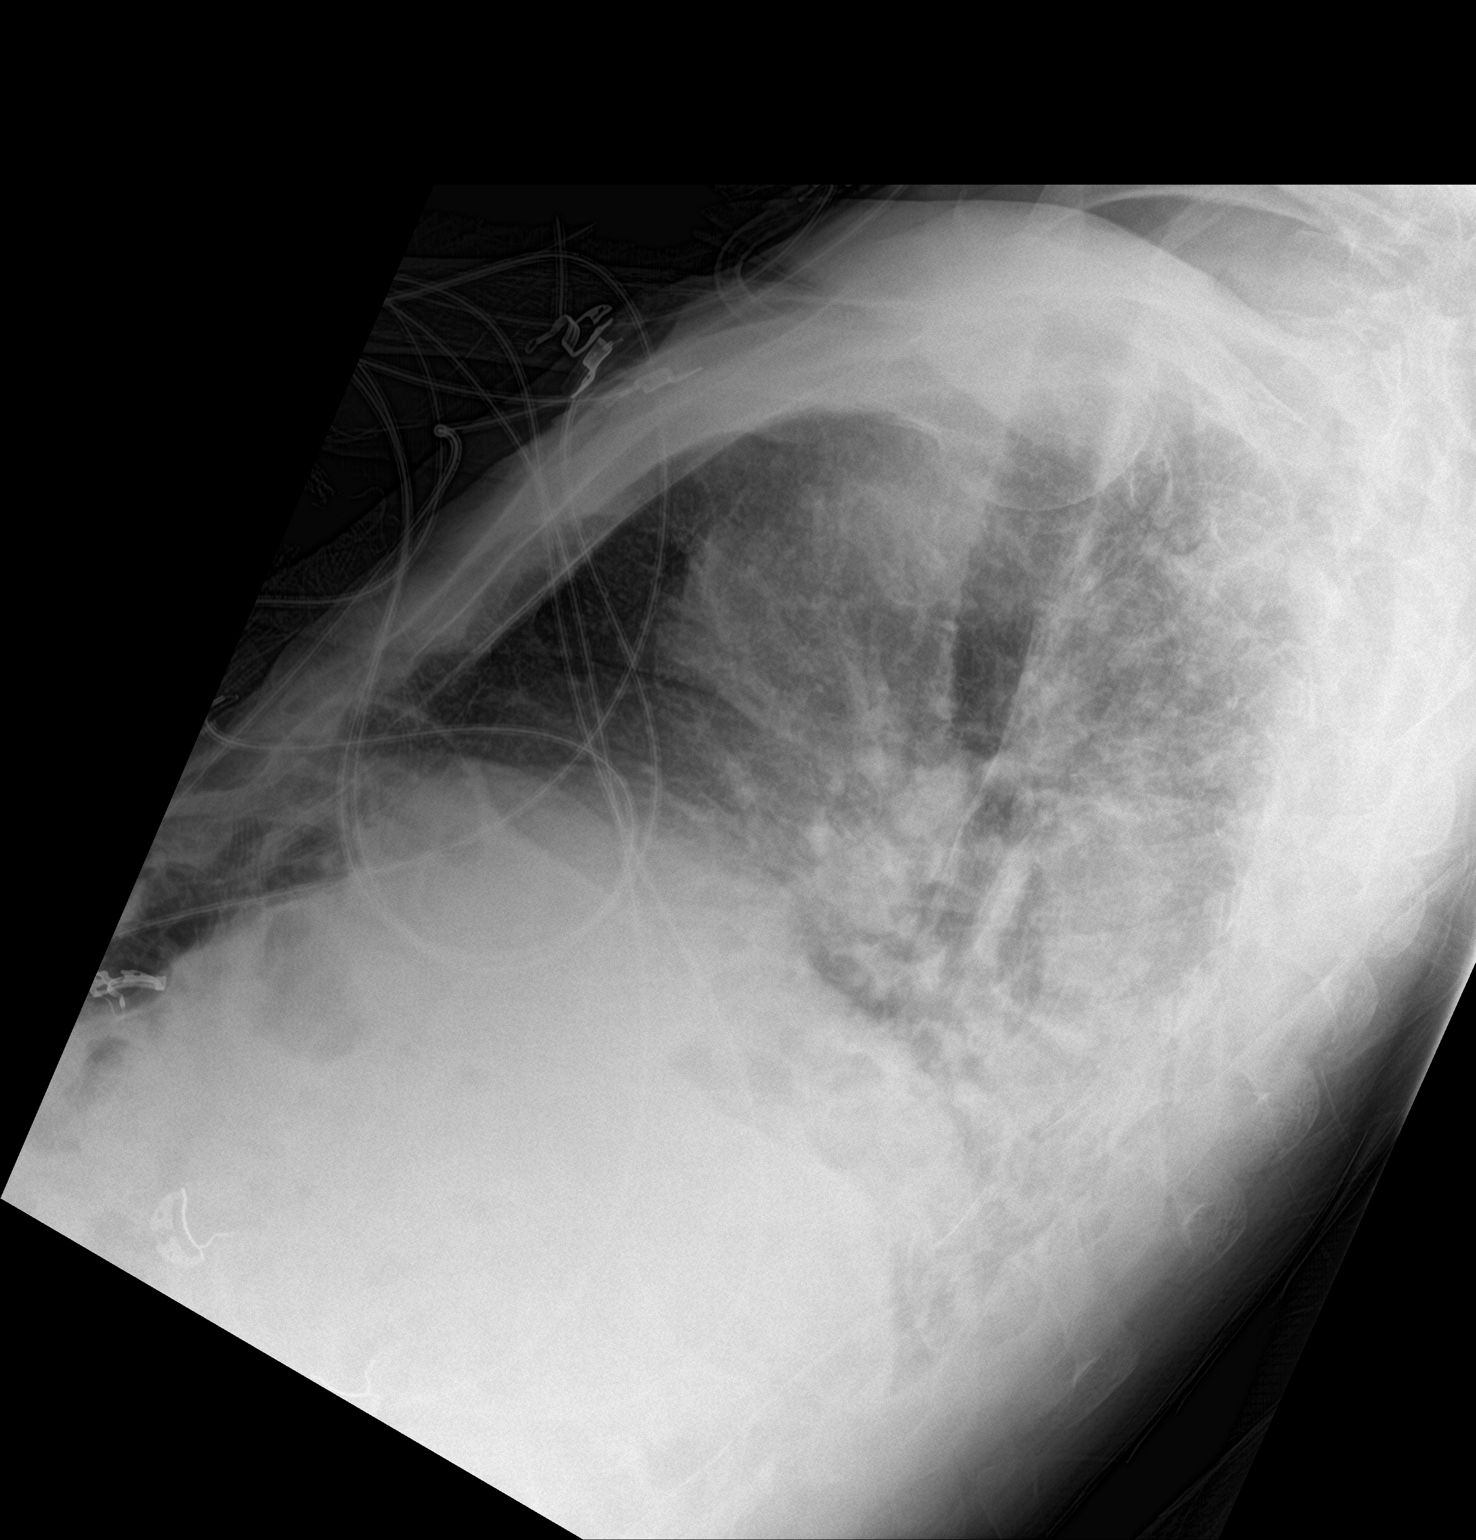

[chest ap]
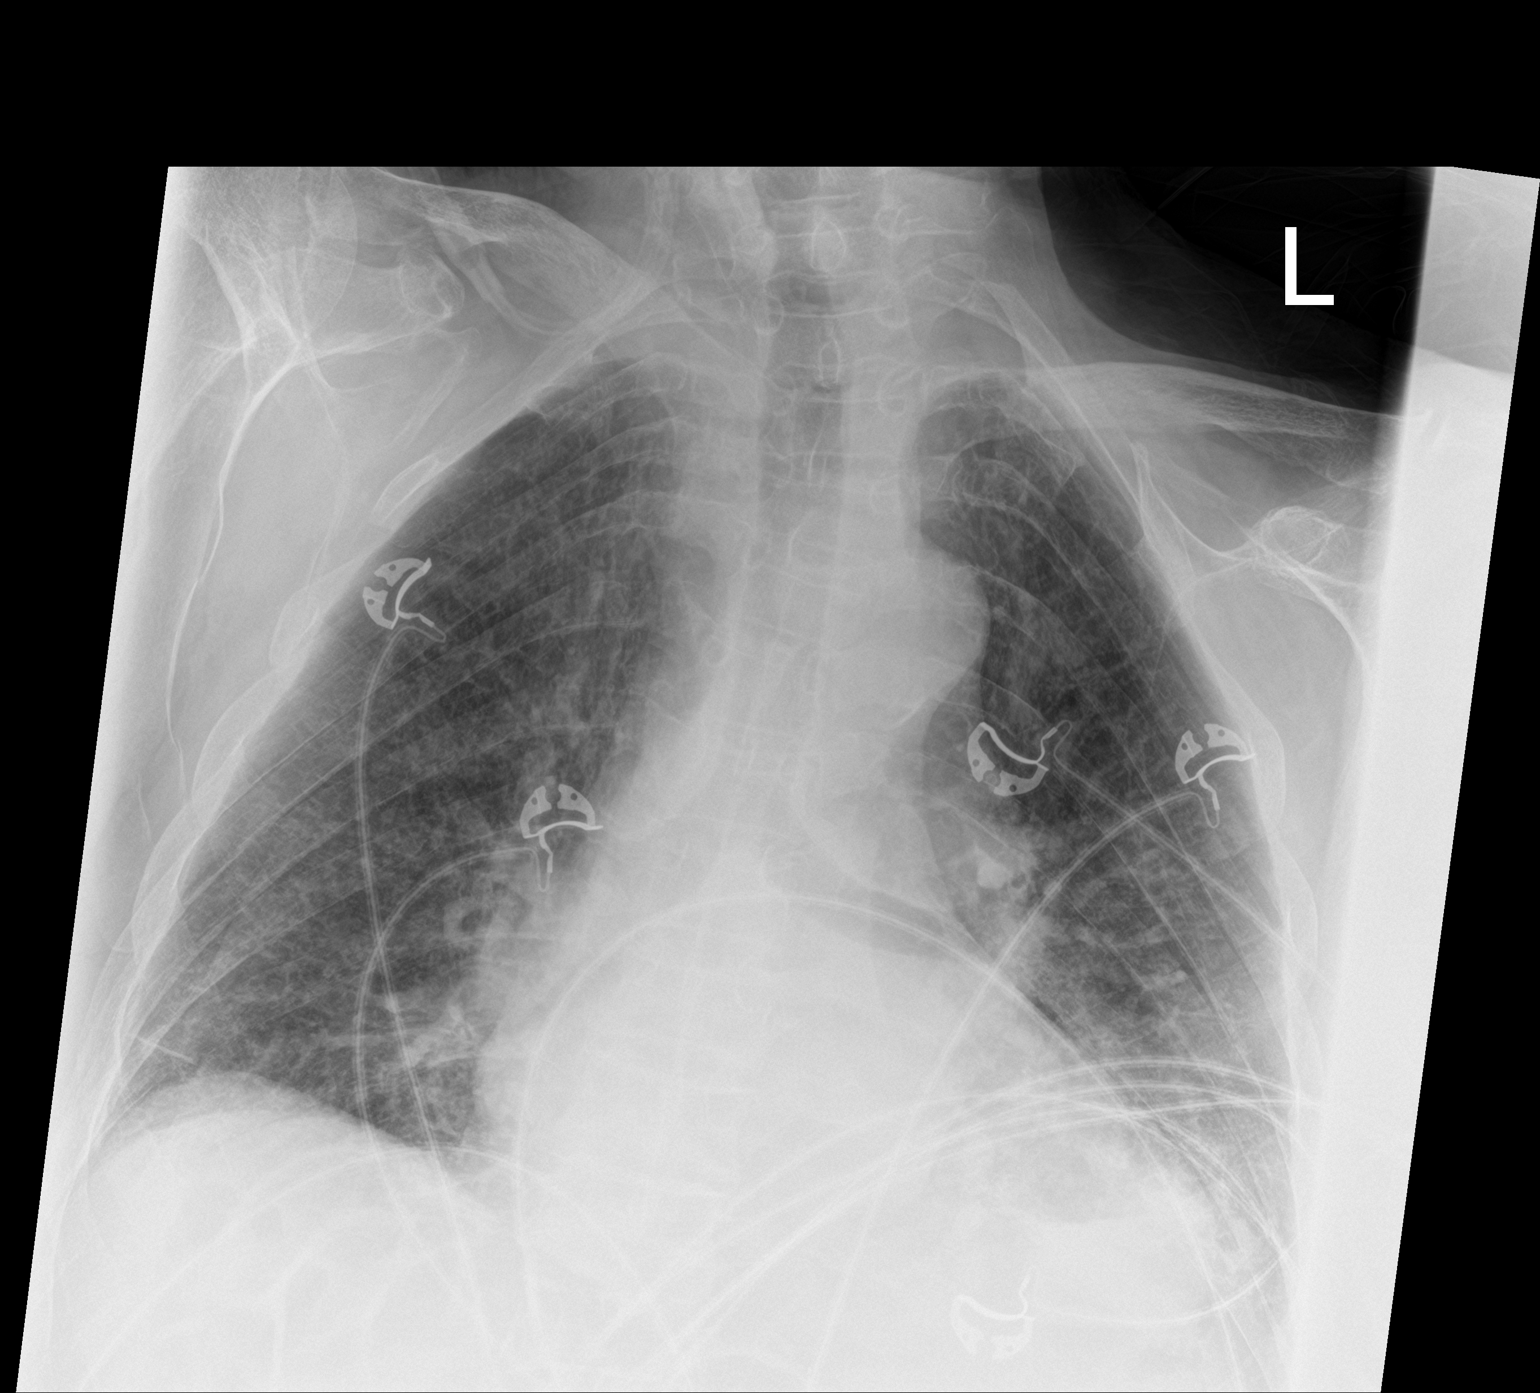

[2 of 2 positions shown; findings below may reference images not displayed]

FINDINGS: Cardiomegaly with tortuous atherosclerotic aorta. Pulmonary
consolidation at the left base suspicious for pneumonia and/or
atelectasis. Mild interstitial edema is noted. Subtle cortical
discontinuity of the right mid and lower scapular cortex may
represent subtle fractures. Correlate for pain and if needed
dedicated scapular views are suggested.
IMPRESSION: Cardiomegaly with pulmonary consolidation at the left lung base
suspicious for atelectasis and/or pneumonia. Mild vascular
congestion.

Slight cortical irregularity of the mid and lower right scapula
query fracture.

## 2019-12-20 IMAGING — NM NM PULMONARY VENT & PERF
16 series · 16 of 16 positions shown · non-contrast
Comparison: Two-view chest x-ray of the same day.

CLINICAL DATA: PE suspected, intermediate probability, positive
D-dimer. Left hip fracture.

EXAM:
NUCLEAR MEDICINE VENTILATION - PERFUSION LUNG SCAN
TECHNIQUE: Ventilation images were obtained in multiple projections using
inhaled aerosol Dc-55m DTPA. Perfusion images were obtained in
multiple projections after intravenous injection of [id number].
RADIOPHARMACEUTICALS:  30.8 mCi of Dc-55m DTPA aerosol inhalation
and 4.28 mCi [id number] IV

[Series 1: ant/post vent · 4.14mm/px · 1 of 1 slices shown (1 of 2)]
[im 1/1]
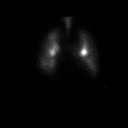

[Series 1: ant/post vent · 4.14mm/px · 1 of 1 slices shown (2 of 2)]
[im 1/1]
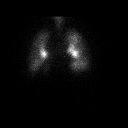

[Series 2: lao/rpo vent · 4.14mm/px · 1 of 1 slices shown (1 of 2)]
[im 1/1]
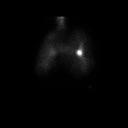

[Series 2: lao/rpo vent · 4.14mm/px · 1 of 1 slices shown (2 of 2)]
[im 1/1]
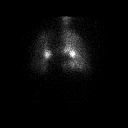

[Series 3: lpo/rao vent · 4.14mm/px · 1 of 1 slices shown (1 of 2)]
[im 1/1]
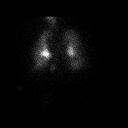

[Series 3: lpo/rao vent · 4.14mm/px · 1 of 1 slices shown (2 of 2)]
[im 1/1]
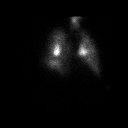

[Series 4: lt lat/rt lat vent · 4.14mm/px · 1 of 1 slices shown (1 of 2)]
[im 1/1]
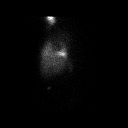

[Series 4: lt lat/rt lat vent · 4.14mm/px · 1 of 1 slices shown (2 of 2)]
[im 1/1]
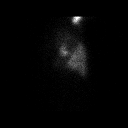

[Series 5: lt lat/rt lat perf · 4.14mm/px · 1 of 1 slices shown (1 of 2)]
[im 1/1]
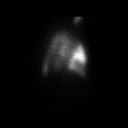

[Series 5: lt lat/rt lat perf · 4.14mm/px · 1 of 1 slices shown (2 of 2)]
[im 1/1]
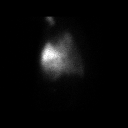

[Series 6: lpo/rao perf · 4.14mm/px · 1 of 1 slices shown (1 of 2)]
[im 1/1]
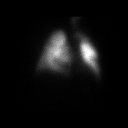

[Series 6: lpo/rao perf · 4.14mm/px · 1 of 1 slices shown (2 of 2)]
[im 1/1]
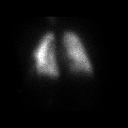

[Series 7: ant/post perf · 4.14mm/px · 1 of 1 slices shown (1 of 2)]
[im 1/1]
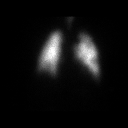

[Series 7: ant/post perf · 4.14mm/px · 1 of 1 slices shown (2 of 2)]
[im 1/1]
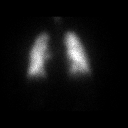

[Series 8: lao/rpo perf · 4.14mm/px · 1 of 1 slices shown (1 of 2)]
[im 1/1]
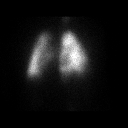

[Series 8: lao/rpo perf · 4.14mm/px · 1 of 1 slices shown (2 of 2)]
[im 1/1]
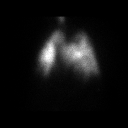

[16 of 16 positions shown; findings below may reference images not displayed]

FINDINGS: Ventilation: No focal ventilation defect. There is some clumping of
material at the perihilar regions bilaterally. No focal defects are
evident.

Perfusion: No wedge shaped peripheral perfusion defects to suggest
acute pulmonary embolism.
IMPRESSION: No pulmonary embolus.
